# Patient Record
Sex: Female | Born: 1955
Health system: Southern US, Community
[De-identification: ages and names within clinical notes are randomized; demographics above are authoritative.]

## PROBLEM LIST (undated history)

## (undated) DIAGNOSIS — Z860101 Personal history of adenomatous and serrated colon polyps: Secondary | ICD-10-CM

## (undated) DIAGNOSIS — I25119 Atherosclerotic heart disease of native coronary artery with unspecified angina pectoris: Secondary | ICD-10-CM

## (undated) DIAGNOSIS — Z86010 Personal history of colonic polyps: Secondary | ICD-10-CM

## (undated) DIAGNOSIS — M1990 Unspecified osteoarthritis, unspecified site: Secondary | ICD-10-CM

## (undated) DIAGNOSIS — E668 Other obesity: Secondary | ICD-10-CM

## (undated) DIAGNOSIS — N951 Menopausal and female climacteric states: Secondary | ICD-10-CM

## (undated) DIAGNOSIS — G4730 Sleep apnea, unspecified: Secondary | ICD-10-CM

## (undated) DIAGNOSIS — Z780 Asymptomatic menopausal state: Secondary | ICD-10-CM

## (undated) DIAGNOSIS — E6689 Other obesity not elsewhere classified: Secondary | ICD-10-CM

## (undated) DIAGNOSIS — F17200 Nicotine dependence, unspecified, uncomplicated: Secondary | ICD-10-CM

## (undated) DIAGNOSIS — E785 Hyperlipidemia, unspecified: Secondary | ICD-10-CM

## (undated) DIAGNOSIS — E7800 Pure hypercholesterolemia, unspecified: Secondary | ICD-10-CM

## (undated) DIAGNOSIS — M79671 Pain in right foot: Secondary | ICD-10-CM

## (undated) DIAGNOSIS — I519 Heart disease, unspecified: Secondary | ICD-10-CM

## (undated) DIAGNOSIS — I10 Essential (primary) hypertension: Secondary | ICD-10-CM

## (undated) DIAGNOSIS — K219 Gastro-esophageal reflux disease without esophagitis: Secondary | ICD-10-CM

## (undated) DIAGNOSIS — I2510 Atherosclerotic heart disease of native coronary artery without angina pectoris: Secondary | ICD-10-CM

## (undated) DIAGNOSIS — E039 Hypothyroidism, unspecified: Secondary | ICD-10-CM

## (undated) DIAGNOSIS — I872 Venous insufficiency (chronic) (peripheral): Secondary | ICD-10-CM

## (undated) HISTORY — DX: Pure hypercholesterolemia, unspecified: E78.00

## (undated) HISTORY — DX: Personal history of adenomatous and serrated colon polyps: Z86.0101

## (undated) HISTORY — DX: Other obesity not elsewhere classified: E66.89

## (undated) HISTORY — DX: Asymptomatic menopausal state: Z78.0

## (undated) HISTORY — DX: Atherosclerotic heart disease of native coronary artery with unspecified angina pectoris: I25.119

## (undated) HISTORY — DX: Venous insufficiency (chronic) (peripheral): I87.2

## (undated) HISTORY — PX: OTHER SURGICAL HISTORY: SHX169

## (undated) HISTORY — DX: Atherosclerotic heart disease of native coronary artery without angina pectoris: I25.10

## (undated) HISTORY — DX: Essential (primary) hypertension: I10

## (undated) HISTORY — DX: Sleep apnea, unspecified: G47.30

## (undated) HISTORY — DX: Pain in right foot: M79.671

## (undated) HISTORY — DX: Gastro-esophageal reflux disease without esophagitis: K21.9

## (undated) HISTORY — DX: Heart disease, unspecified: I51.9

## (undated) HISTORY — DX: Personal history of colonic polyps: Z86.010

## (undated) HISTORY — DX: Hypothyroidism, unspecified: E03.9

## (undated) HISTORY — PX: CHOLECYSTECTOMY: SHX55

## (undated) HISTORY — DX: Other obesity: E66.8

## (undated) HISTORY — PX: ABDOMINAL HYSTERECTOMY: SHX81

## (undated) HISTORY — DX: Menopausal and female climacteric states: N95.1

## (undated) HISTORY — DX: Nicotine dependence, unspecified, uncomplicated: F17.200

## (undated) HISTORY — DX: Hyperlipidemia, unspecified: E78.5

---

## 1998-05-15 HISTORY — PX: CORONARY ARTERY BYPASS GRAFT: SHX141

## 1998-07-27 ENCOUNTER — Inpatient Hospital Stay (HOSPITAL_COMMUNITY): Admission: AD | Admit: 1998-07-27 | Discharge: 1998-08-06 | Payer: Self-pay | Admitting: *Deleted

## 1998-07-29 ENCOUNTER — Encounter: Payer: Self-pay | Admitting: Thoracic Surgery (Cardiothoracic Vascular Surgery)

## 1998-07-30 ENCOUNTER — Encounter: Payer: Self-pay | Admitting: Thoracic Surgery (Cardiothoracic Vascular Surgery)

## 1998-07-31 ENCOUNTER — Encounter: Payer: Self-pay | Admitting: Thoracic Surgery (Cardiothoracic Vascular Surgery)

## 1998-08-01 ENCOUNTER — Encounter: Payer: Self-pay | Admitting: Thoracic Surgery (Cardiothoracic Vascular Surgery)

## 1998-08-02 ENCOUNTER — Encounter: Payer: Self-pay | Admitting: Thoracic Surgery (Cardiothoracic Vascular Surgery)

## 1998-08-03 ENCOUNTER — Encounter: Payer: Self-pay | Admitting: Thoracic Surgery (Cardiothoracic Vascular Surgery)

## 1998-08-05 ENCOUNTER — Encounter: Payer: Self-pay | Admitting: Thoracic Surgery (Cardiothoracic Vascular Surgery)

## 1998-08-10 ENCOUNTER — Inpatient Hospital Stay (HOSPITAL_COMMUNITY): Admission: EM | Admit: 1998-08-10 | Discharge: 1998-08-15 | Payer: Self-pay | Admitting: Emergency Medicine

## 1998-08-10 ENCOUNTER — Encounter: Payer: Self-pay | Admitting: Thoracic Surgery (Cardiothoracic Vascular Surgery)

## 1998-08-11 ENCOUNTER — Encounter: Payer: Self-pay | Admitting: Thoracic Surgery (Cardiothoracic Vascular Surgery)

## 1998-08-23 ENCOUNTER — Other Ambulatory Visit: Admission: RE | Admit: 1998-08-23 | Discharge: 1998-08-23 | Payer: Self-pay | Admitting: Gastroenterology

## 2000-12-27 ENCOUNTER — Ambulatory Visit (HOSPITAL_COMMUNITY): Admission: RE | Admit: 2000-12-27 | Discharge: 2000-12-27 | Payer: Self-pay | Admitting: Pulmonary Disease

## 2002-02-04 ENCOUNTER — Encounter (HOSPITAL_COMMUNITY): Admission: RE | Admit: 2002-02-04 | Discharge: 2002-03-06 | Payer: Self-pay | Admitting: Cardiology

## 2002-02-04 ENCOUNTER — Ambulatory Visit (HOSPITAL_COMMUNITY): Admission: RE | Admit: 2002-02-04 | Discharge: 2002-02-04 | Payer: Self-pay | Admitting: Pulmonary Disease

## 2002-04-14 ENCOUNTER — Ambulatory Visit (HOSPITAL_COMMUNITY): Admission: RE | Admit: 2002-04-14 | Discharge: 2002-04-14 | Payer: Self-pay | Admitting: Internal Medicine

## 2002-04-14 HISTORY — PX: COLONOSCOPY: SHX174

## 2002-07-04 ENCOUNTER — Ambulatory Visit (HOSPITAL_COMMUNITY): Admission: RE | Admit: 2002-07-04 | Discharge: 2002-07-04 | Payer: Self-pay | Admitting: Pulmonary Disease

## 2003-05-05 ENCOUNTER — Ambulatory Visit (HOSPITAL_COMMUNITY): Admission: RE | Admit: 2003-05-05 | Discharge: 2003-05-05 | Payer: Self-pay | Admitting: Pulmonary Disease

## 2004-04-13 ENCOUNTER — Ambulatory Visit: Payer: Self-pay

## 2005-02-27 ENCOUNTER — Ambulatory Visit (HOSPITAL_COMMUNITY): Admission: RE | Admit: 2005-02-27 | Discharge: 2005-02-27 | Payer: Self-pay | Admitting: Pulmonary Disease

## 2006-03-12 ENCOUNTER — Ambulatory Visit (HOSPITAL_COMMUNITY): Admission: RE | Admit: 2006-03-12 | Discharge: 2006-03-12 | Payer: Self-pay | Admitting: Pulmonary Disease

## 2006-07-20 ENCOUNTER — Ambulatory Visit: Payer: Self-pay | Admitting: Cardiology

## 2006-08-07 ENCOUNTER — Ambulatory Visit: Payer: Self-pay

## 2006-08-08 ENCOUNTER — Ambulatory Visit: Payer: Self-pay

## 2007-04-12 ENCOUNTER — Ambulatory Visit (HOSPITAL_COMMUNITY): Admission: RE | Admit: 2007-04-12 | Discharge: 2007-04-12 | Payer: Self-pay | Admitting: Pulmonary Disease

## 2007-04-16 ENCOUNTER — Ambulatory Visit (HOSPITAL_COMMUNITY): Admission: RE | Admit: 2007-04-16 | Discharge: 2007-04-16 | Payer: Self-pay | Admitting: Internal Medicine

## 2007-04-16 ENCOUNTER — Ambulatory Visit: Payer: Self-pay | Admitting: Internal Medicine

## 2007-04-16 ENCOUNTER — Encounter: Payer: Self-pay | Admitting: Internal Medicine

## 2007-04-16 HISTORY — PX: COLONOSCOPY: SHX5424

## 2007-05-22 ENCOUNTER — Ambulatory Visit (HOSPITAL_COMMUNITY): Admission: RE | Admit: 2007-05-22 | Discharge: 2007-05-22 | Payer: Self-pay | Admitting: Internal Medicine

## 2007-05-23 ENCOUNTER — Ambulatory Visit (HOSPITAL_COMMUNITY): Admission: RE | Admit: 2007-05-23 | Discharge: 2007-05-23 | Payer: Self-pay | Admitting: Pulmonary Disease

## 2007-10-21 ENCOUNTER — Ambulatory Visit (HOSPITAL_COMMUNITY): Admission: RE | Admit: 2007-10-21 | Discharge: 2007-10-21 | Payer: Self-pay | Admitting: Cardiology

## 2009-02-10 ENCOUNTER — Encounter: Payer: Self-pay | Admitting: Orthopedic Surgery

## 2009-02-10 ENCOUNTER — Ambulatory Visit (HOSPITAL_COMMUNITY): Admission: RE | Admit: 2009-02-10 | Discharge: 2009-02-10 | Payer: Self-pay | Admitting: Pulmonary Disease

## 2009-02-18 ENCOUNTER — Encounter: Payer: Self-pay | Admitting: Orthopedic Surgery

## 2009-02-18 ENCOUNTER — Ambulatory Visit (HOSPITAL_COMMUNITY): Admission: RE | Admit: 2009-02-18 | Discharge: 2009-02-18 | Payer: Self-pay | Admitting: Pulmonary Disease

## 2009-03-15 ENCOUNTER — Ambulatory Visit: Payer: Self-pay | Admitting: Orthopedic Surgery

## 2009-03-15 DIAGNOSIS — M76829 Posterior tibial tendinitis, unspecified leg: Secondary | ICD-10-CM | POA: Insufficient documentation

## 2009-05-17 ENCOUNTER — Ambulatory Visit: Payer: Self-pay | Admitting: Orthopedic Surgery

## 2009-10-29 ENCOUNTER — Ambulatory Visit: Admission: RE | Admit: 2009-10-29 | Discharge: 2009-10-29 | Payer: Self-pay | Admitting: Pulmonary Disease

## 2010-01-27 ENCOUNTER — Ambulatory Visit: Payer: Self-pay | Admitting: Otolaryngology

## 2010-03-22 ENCOUNTER — Ambulatory Visit (HOSPITAL_COMMUNITY): Admission: RE | Admit: 2010-03-22 | Discharge: 2010-03-22 | Payer: Self-pay | Admitting: Pulmonary Disease

## 2010-06-14 NOTE — Assessment & Plan Note (Signed)
Summary: 6 WK RECK FOOT AFTER BRACE/BCBS/BSF   Visit Type:  Follow-up Referring Provider:  Dr Juanetta Gosling   CC:  ankle pain.  History of Present Illness: I saw Mariah Stewart in the office today for a followup visit.  She is a 55 years old woman with the complaint of:  DX: Posterior tibial tendon tear.  Treatment: Short cam walker    Complaints: doing better, swelling is down, no pain.  No meds for this.  Today, scheduled for: 6 week recheck right foot after cam walker.   Physical examination  She has excellent posterior tibial strength in plantarflexion and inversion. She has functional range of motion the ankle. There is no tenderness or swelling in the medial ankle.  Assessment improved status post posterior tibial tendon partial tearing. Plan normal activity. Follow up as needed  Allergies: No Known Drug Allergies   Impression & Recommendations:  Problem # 1:  TENDINITIS TIBIALIS (ICD-726.72) Assessment Improved  Orders: Est. Patient Level II (22025)  Patient Instructions: 1)  Please schedule a follow-up appointment as needed.

## 2010-09-27 NOTE — Op Note (Signed)
NAME:  Mariah Stewart, Mariah Stewart                 ACCOUNT NO.:  [id number]   MEDICAL RECORD NO.:  [id number]          PATIENT TYPE:  AMB   LOCATION:  DAY                           FACILITY:  [facility]   PHYSICIAN:  R. Roetta Sessions, M.D. DATE OF BIRTH:  [birth year]   DATE OF PROCEDURE:  04/16/2007  DATE OF DISCHARGE:                               OPERATIVE REPORT   INDICATIONS FOR PROCEDURE:  The patient is a 51-year African-American  female with a personal history of colonic adenomas, positive family  history of colon cancer in her mother.  Dr. Karilyn Cota last performed  colonoscopy in 2003.  She had somewhat elongated redundant colon, poor  prep, but no abnormalities were seen.  Prior to this, she had a  colonoscopy down in [place], and polyps were found.  She currently  has no lower GI tract symptoms.  Colonoscopy is now being done as a  surveillance maneuver.  This approach has been discussed with the  patient at length.  Potential risks, benefits and alternatives have been  reviewed, questions answered.   PROCEDURE NOTE:  O2 saturation, blood pressure, pulse and respirations  were monitored throughout the entire procedure.  Conscious sedation  Versed 7 mg IV, Demerol 125 mg IV in divided doses.   INSTRUMENT:  Pentax video chip system.   FINDINGS:  Digital rectal exam revealed no abnormalities.   ENDOSCOPIC FINDINGS:  Prep was suboptimal with some pooling of viscous  stool throughout the colon.   Colon:  Colonic mucosa was surveyed from the rectosigmoid junction,  through the left, transverse, right colon to the ileocecal valve.  This  patient's colon was quite elongated and redundant.  I made it to  proximally 15 to 20 cm from the ileocecal valve.  I saw it in the  distance.  However, redundancy made cecal intubation a challenge.  In  fact, at that this level from the ileocecal valve, I spent 30 minutes  using a combination of external abdominal pressure and changing the  patient's position  through staff members to assist in my efforts to  intubate the cecum which ultimately never happened.  I saw approximately  one half of the cecal mucosa from the position stated.  There were no  obvious abnormalities, but the cecum was not cleared.  From this level,  the scope was slowly and cautiously withdrawn, and all previously  mentioned mucosal surfaces were again seen.  The patient had scattered  narrow mouth diverticula, and there was a diminutive polyp in the  ascending colon which was cold biopsied/removed.  The remainder of the  colonic mucosa appeared normal although poor prep made the exam more  difficult.  Scope was pulled down into the rectum where thorough  examination of the rectal mucosa including retroflexed view of the anal  verge demonstrated a 5-mm hyperplastic appearing polyp 10 cm in from the  anal verge.  This was cold biopsied and essentially totally removed.  There was a surprising amount of bleeding with this maneuver for which I  decided to go ahead and place a resolution clip.  The first clip  did not  deploy adequately and came off, so I placed a second clip with achieving  good hemostasis and removed the first with a Roth net.  The patient  tolerated the procedure well and was reactive to endoscopy.   IMPRESSION:  1. Diminutive rectal polyp removed as described above, status post      clipping, otherwise normal rectum.  2. Scattered diverticula throughout the colon.  Long, long redundant      colon with suboptimal prep made the exam more difficult.  Ascending      colon polyp removed as described above.  Ileocecal valve seen with      the cecum not completely seen as described above.   RECOMMENDATIONS:  1. Follow-up on pathology.  2. Plan for air-contrast barium enema to image the cecum in 1 month.  3. Diverticulosis literature provided Ms. Glab.  4. Further recommendations to follow.      Jonathon Bellows, M.D.  Electronically Signed      RMR/MEDQ  D:  04/16/2007  T:  04/16/2007  Job:  [id number]   cc:   Ramon Dredge L. Juanetta Gosling, M.D.  Fax: [phone]

## 2010-09-30 NOTE — Consult Note (Signed)
NAME:  Mariah Stewart, Mariah Stewart                           ACCOUNT NO.:  [id number]   MEDICAL RECORD NO.:  [id number]                   PATIENT TYPE:  OUT   LOCATION:  RAD                                  FACILITY:  [facility]   PHYSICIAN:  Edward L. Juanetta Gosling, M.D.             DATE OF BIRTH:  April 20, 1956   DATE OF CONSULTATION:  DATE OF DISCHARGE:  02/04/2002                                   CONSULTATION   REASON FOR CONSULTATION:  Colonoscopy.   HISTORY OF PRESENT ILLNESS:  Mariah Stewart is a pleasant 55 year old black female, a  patient of Dr. Juanetta Gosling, who presents today for a colonoscopy.  She has a  family history of colon cancer; her mother was diagnosed with colon cancer  in 2001 at age 51.  She received a colostomy with radiation therapy,  initially, but then had a recurrence with mets to her lungs and pelvis in  May 2003.  She is currently undergoing chemotherapy.  Mariah Stewart has had a history  of colonic polyps, diagnosed in 2000.  She tells me she is supposed to have  a colonoscopy every three years.  The initial colonoscopy was done by Dr.  Russella Dar.  She denies any abdominal pain, melena, rectal bleeding,  constipation, diarrhea, nausea or vomiting, heartburn, weight loss.   CURRENT MEDICATIONS:  1. Aspirin 325 mg q.d.  2. Synthroid 175 mcg.  3. Tarka 4/240 mg q.d.  4. Estratest h.s. q.d.  5. Demadex 20 q.d.  6. Lipitor 40 mg q.d.  7. K-Dur 20 mEq/L q.d.   ALLERGIES:  No known drug allergies.   PAST MEDICAL HISTORY:  1. Hypothyroidism.  2. Hypertension.  3. Hypercholesterolemia.  4. Hormone replacement therapy.  5. Coronary artery disease status post CABG, 3 vessel CABG in 2000.     Cardiolite Stress Test in October 2003 was normal, per her report.  She     sees Dr. Daleen Squibb, regularly.  6. Cholecystotomy, 15 years ago for cholelithiasis.  7. Hysterectomy in 1996.   FAMILY HISTORY:  Mother was diagnosed with colon cancer at the age of 74.  She initially had  colostomy with radiation therapy.  She  had a recurrence  of the disease with mets to the lung and pelvis noted in May 2003.  She is  currently undergone 6 treatments with chemotherapy.  Father died of MI.   SOCIAL HISTORY:  She is single; she has no children.  She is employed with  [employer].  She quit smoking in 2000; denies any alcohol.   REVIEW OF SYSTEMS:  Please see HPI  GI.  GENERAL:  Denies any weight loss or  fatigue.  Cardiopulmonary, occasionally has some dyspnea on exertion, no  chest pain or shortness of breath at rest.   PHYSICAL EXAMINATION:  VITAL SIGNS:  Weight:  251.5, Height:  5' 3, temp  97.9, blood pressure 130/84, pulse 80.  GENERAL:  A very pleasant,  moderately obese black female in no acute  distress.  SKIN:  Warm and dry, no jaundice.  HEENT:  Conjunctiva are pink.  Sclerae are nonicteric.  Pupils equal, round  and reactive to light.  Oropharyngeal mucosa moist and pink, no lesions,  erythema, or exudate.  No lymphadenopathy, thyromegaly, carotid bruits.  CHEST:  Clear to auscultation.  CARDIAC:  Reveals regular rate and rhythm, normal S1, S2, no murmurs, rubs  or gallops.  ABDOMEN:  Positive bowel sounds; obese, symmetrical, soft and nontender.  No  organomegaly or masses.  RECTAL:  Deferred to total colostomy.  EXTREMITIES:  No edema.   IMPRESSION:  Mariah Stewart is a pleasant 55 year old female, who has a family history  of colon cancer and personal history of colonic polyps.  Her last  colonoscopy was in the year 2000.  She is due for high risk surveilance  colonoscopy at this time.  I discussed with her the concerns and benefits of  a colonoscopy, and she is agreeable to proceed.   PLAN:  1. Colonoscopy in the near future.   I would like to thank Dr. Juanetta Gosling for allowing [place] to take part in the care  of this patient.   __________     Tana Coast, P.A.                        Edward L. Juanetta Gosling, M.D.    LL/MEDQ  D:  03/31/2002  T:  03/31/2002  Job:  [id number]   cc:   Ramon Dredge L. Juanetta Gosling,  M.D.  [address]  [place]  [place] [address]  Fax: [phone]

## 2010-09-30 NOTE — Op Note (Signed)
NAME:  Mariah Stewart, Mariah Stewart                           ACCOUNT NO.:  [id number]   MEDICAL RECORD NO.:  [id number]                   PATIENT TYPE:  AMB   LOCATION:  DAY                                  FACILITY:  [facility]   PHYSICIAN:  Lionel December, M.D.                 DATE OF BIRTH:  [birth year]   DATE OF PROCEDURE:  04/14/2002  DATE OF DISCHARGE:                                 OPERATIVE REPORT   PROCEDURE:  Total colonoscopy.   INDICATIONS:  The patient is a 55 year old African-American female with  history of colonic polyps and a family history of colon carcinoma.  Her last  colonoscopy was about three years ago in [place], and she decided to have  this exam locally.  Her mother was diagnosed with colon carcinoma at age 88.  She presently does not have any GI symptoms.  The procedure and risks were  reviewed with the patient and informed consent was obtained.   PREMEDICATION:  Demerol 50 mg IV, Versed 6 mg IV in divided dose.   INSTRUMENT USED:  Olympus video system.   FINDINGS:  Procedure performed in endoscopy suite.  The patient's vital  signs and O2 saturation were monitored during the procedure and remained  stable.  The patient was placed in the left lateral recumbent position and  rectal examination performed.  This was within normal limits.  The scope was  placed in the rectum and advanced under vision into sigmoid colon and  beyond.  Preparation was fair.  She had thick liquid stool in various  segments of the colon.  The scope was passed to the cecum, which was  identified by the ileocecal valve.  The cecum was normal.  As the scope was  withdrawn, colonic mucosa was once again carefully examined and several  areas had to be washed free of stool to make sure underlying mucosa was  normal.  There were no polyps or tumor masses.  Rectal mucosa similarly was  normal.  The scope was retroflexed to examine the anorectal junction.  Hemorrhoids were noted below the dentate line.   The endoscope was  straightened and withdrawn.  The patient tolerated the procedure well.   FINAL DIAGNOSES:  1. Examination performed to the cecum.  2. External hemorrhoids, otherwise normal examination.  Preparation was less     than ideal.    RECOMMENDATIONS:  She should continue yearly Hemoccults and should return  for colonoscopy no later than five years from now.                                               Lionel December, M.D.    NR/MEDQ  D:  04/14/2002  T:  04/14/2002  Job:  [id number]   cc:   Ramon Dredge L.  Juanetta Gosling, M.D.  [address]  [place]  [place] [address]  Fax: [phone]

## 2010-09-30 NOTE — Assessment & Plan Note (Signed)
[facility]                            CARDIOLOGY OFFICE NOTE   Mariah Stewart                        MRN:          [id number]  DATE:07/20/2006                            DOB:          14-Jun-1955    Mariah Stewart returns today for further management of the following issues.  Coronary artery disease status post coronary artery bypass grafting  September of 2000.  Her ejection fraction was 60%.  Her last stress  Myoview was in 2003, which basically showed no ischemia.  EF of 65%.   She is having no angina or ischemic symptoms.   She has a history of hypertension, which has been difficult to control,  as well as hyperlipidemia.  She is following along with Dr. Juanetta Stewart for  this.  She brings recent blood work in November 2007 which shows her  lipids to be at goal, except for a low HDL.  Normal TSH.  Normal LFTs.  Normal CBC.  Normal chemistry profile including creatinine of 0.74.   EXAM:  Blood pressure is 148/94, pulse 87 and regular, weight 243.  She is extremely pleasant, as always.  HEENT:  Normocephalic, atraumatic.  PERRLA.  Extraocular movements  intact.  Sclerae clear.  Dentition is satisfactory.  NECK:  Supple.  Carotid upstrokes equal bilaterally without bruits.  No  JVD.  Thyroid is not enlarged.  Trachea is midline.  LUNGS:  Clear.  HEART:  Reveals a poorly-appreciated PMI.  She has normal S1, S2.  ABDOMEN:  Protuberant.  Good bowel sounds.  No organomegaly.  EXTREMITIES:  1+ edema.  Pulses are intact.   EKG:  Normal, except for some nonspecific ST-T wave changes.  This is  unchanged from before.   ASSESSMENT AND PLAN:  Coronary artery disease, currently without any  symptoms of clinical ischemia.   I have asked her to return for an exercise rest/stress Myoview.  If this  is negative for ischemia or low risk, we will see her back in a year.     Redcay C. Daleen Squibb, MD, [facility]  Electronically Signed    TCW/MedQ  DD: 07/20/2006  DT:  07/21/2006  Job #: [id number]   cc:   Ramon Dredge L. Mariah Stewart, M.D.

## 2011-02-01 ENCOUNTER — Other Ambulatory Visit (HOSPITAL_COMMUNITY): Payer: Self-pay | Admitting: Pulmonary Disease

## 2011-02-01 ENCOUNTER — Ambulatory Visit (HOSPITAL_COMMUNITY)
Admission: RE | Admit: 2011-02-01 | Discharge: 2011-02-01 | Disposition: A | Discharge status: Home / Self Care | Payer: BC Managed Care – PPO | Source: Ambulatory Visit | Attending: Pulmonary Disease | Admitting: Pulmonary Disease

## 2011-02-01 DIAGNOSIS — M79671 Pain in right foot: Secondary | ICD-10-CM

## 2011-02-01 DIAGNOSIS — M25579 Pain in unspecified ankle and joints of unspecified foot: Secondary | ICD-10-CM | POA: Insufficient documentation

## 2011-02-01 DIAGNOSIS — M898X9 Other specified disorders of bone, unspecified site: Secondary | ICD-10-CM | POA: Insufficient documentation

## 2011-02-01 DIAGNOSIS — Z139 Encounter for screening, unspecified: Secondary | ICD-10-CM

## 2011-03-27 ENCOUNTER — Ambulatory Visit (HOSPITAL_COMMUNITY)
Admission: RE | Admit: 2011-03-27 | Discharge: 2011-03-27 | Disposition: A | Discharge status: Home / Self Care | Payer: BC Managed Care – PPO | Source: Ambulatory Visit | Attending: Pulmonary Disease | Admitting: Pulmonary Disease

## 2011-03-27 DIAGNOSIS — Z139 Encounter for screening, unspecified: Secondary | ICD-10-CM

## 2011-03-27 DIAGNOSIS — Z1231 Encounter for screening mammogram for malignant neoplasm of breast: Secondary | ICD-10-CM | POA: Insufficient documentation

## 2011-04-18 ENCOUNTER — Other Ambulatory Visit (HOSPITAL_COMMUNITY): Payer: Self-pay | Admitting: Pulmonary Disease

## 2011-04-18 DIAGNOSIS — Z780 Asymptomatic menopausal state: Secondary | ICD-10-CM

## 2011-04-18 DIAGNOSIS — Z139 Encounter for screening, unspecified: Secondary | ICD-10-CM

## 2011-04-20 ENCOUNTER — Encounter: Payer: Self-pay | Admitting: Internal Medicine

## 2011-04-24 ENCOUNTER — Ambulatory Visit (INDEPENDENT_AMBULATORY_CARE_PROVIDER_SITE_OTHER): Payer: BC Managed Care – PPO | Admitting: Gastroenterology

## 2011-04-24 ENCOUNTER — Encounter: Payer: Self-pay | Admitting: Gastroenterology

## 2011-04-24 VITALS — BP 130/80 | HR 67 | Temp 97.9°F | Ht 63.0 in | Wt 250.4 lb

## 2011-04-24 DIAGNOSIS — K921 Melena: Secondary | ICD-10-CM

## 2011-04-24 DIAGNOSIS — Z800 Family history of malignant neoplasm of digestive organs: Secondary | ICD-10-CM | POA: Insufficient documentation

## 2011-04-24 NOTE — Progress Notes (Unsigned)
Saw she was taking Naprosyn daily. Can we find out if this is just prn or if she does take this religiously?  Thanks!

## 2011-04-24 NOTE — Assessment & Plan Note (Signed)
Proceed with surveillance colonoscopy.

## 2011-04-24 NOTE — Patient Instructions (Signed)
We have set you up for a colonoscopy with Dr. Jena Gauss.  Further recommendations to follow.  Have a [place] Christmas!

## 2011-04-24 NOTE — Progress Notes (Signed)
Referring Provider: Fredirick Maudlin, MD Primary Care Physician:  Fredirick Maudlin, MD Primary Gastroenterologist:  Dr. Jena Gauss   Chief Complaint  Patient presents with  . Rectal Bleeding    HPI:   [name] is a pleasant 55 year old female who comes today at the request of Dr. Juanetta Gosling secondary to intermittent rectal bleeding. She has a history of adenomatous polyps in the past, as well as a FH of colon cancer in her mother, diagnosed at age 41. Her last colonoscopy was in December 2008, with tubular adenoma and scattered diverticula. She reports three different episodes of painless hematochezia. No rectal pruritis or burning. No bowel changes. Denies abdominal pain, N/V. No wt loss. She believes she may have gained weight. She was started on Protonix a few months ago and reports doing well without any dysphagia, breakthrough reflux.    Past Medical History  Diagnosis Date  . Hypertension   . Hypercholesterolemia   . GERD (gastroesophageal reflux disease)   . CAD (coronary artery disease)   . Hypothyroidism   . Hx of adenomatous colonic polyps     Past Surgical History  Procedure Date  . Colonoscopy 04/16/07    diminutive rectal polyp removed, scattered diverticula, path: tubular adenoma  . Colonoscopy 04/14/02    external hemorrhoids otherwise normal  . S/p hysterectomy   . Coronary artery bypass graft 2000    triple bypass  . Cholecystectomy     Current Outpatient Prescriptions  Medication Sig Dispense Refill  . aspirin 81 MG tablet Take 81 mg by mouth daily.        . CRESTOR 10 MG tablet Take 10 mg by mouth daily.       Marland Kitchen EST ESTROGENS-METHYLTEST HS 0.625-1.25 MG per tablet Take 1 tablet by mouth daily.       Marland Kitchen levothyroxine (SYNTHROID, LEVOTHROID) 175 MCG tablet Take 175 mcg by mouth daily.       Marland Kitchen lisinopril-hydrochlorothiazide (PRINZIDE,ZESTORETIC) 20-12.5 MG per tablet Take 1 tablet by mouth.       . losartan (COZAAR) 100 MG tablet Take 100 mg by mouth daily.         . naproxen (NAPROSYN) 500 MG tablet Take 500 mg by mouth daily.       . pantoprazole (PROTONIX) 40 MG tablet Take 40 mg by mouth daily.       . verapamil (CALAN-SR) 240 MG CR tablet Take 240 mg by mouth at bedtime.         Allergies as of 04/24/2011  . (No Known Allergies)    Family History  Problem Relation Age of Onset  . Colon cancer Mother     diagnosed at age 71, passed away at age 55 from metastasis    History   Social History  . Marital Status: Single    Spouse Name: N/A    Number of Children: 0  . Years of Education: N/A   Occupational History  . full-time [employer]   Social History Main Topics  . Smoking status: Current Everyday Smoker -- 0.5 packs/day    Types: Cigarettes  . Smokeless tobacco: Not on file  . Alcohol Use: No  . Drug Use: No  . Sexually Active: Not on file   Other Topics Concern  . Not on file   Social History Narrative  . No narrative on file    Review of Systems: Gen: Denies any fever, chills, loss of appetite, fatigue, weight loss. CV: Denies chest pain, heart palpitations, syncope, peripheral edema. Resp:  Denies shortness of breath with rest, cough, wheezing GI: Denies dysphagia or odynophagia. Denies hematemesis, fecal incontinence, or jaundice.  GU : Denies urinary burning, urinary frequency, urinary incontinence.  MS: Denies joint pain, muscle weakness, cramps, limited movement Derm: Denies rash, itching, dry skin Psych: Denies depression, anxiety, confusion or memory loss  Heme: Denies bruising, bleeding, and enlarged lymph nodes.  Physical Exam: BP 130/80  Pulse 67  Temp(Src) 97.9 F (36.6 C) (Temporal)  Ht 5\' 3"  (1.6 m)  Wt 250 lb 6.4 oz (113.581 kg)  BMI 44.36 kg/m2 General:   Alert and oriented. Well-developed, well-nourished, pleasant and cooperative. Head:  Normocephalic and atraumatic. Eyes:  Conjunctiva pink, sclera clear, no icterus.   Conjunctiva pink. Ears:  Normal auditory acuity. Nose:  No  deformity, discharge,  or lesions. Mouth:  No deformity or lesions, mucosa pink and moist.  Neck:  Supple, without mass or thyromegaly. Lungs:  Clear to auscultation bilaterally, without wheezing, rales, or rhonchi.  Heart:  S1, S2 present without murmurs noted.  Abdomen:  +BS, soft, obese, non-tender and non-distended. Without mass or HSM. No rebound or guarding. No hernias noted. Rectal:  Deferred  Msk:  Symmetrical without gross deformities. Normal posture. Extremities:  Without clubbing or edema. Neurologic:  Alert and  oriented x4;  grossly normal neurologically. Skin:  Intact, warm and dry without significant lesions or rashes Cervical Nodes:  No significant cervical adenopathy. Psych:  Alert and cooperative. Normal mood and affect.

## 2011-04-24 NOTE — Assessment & Plan Note (Addendum)
55 year old female with intermittent painless hematochezia noted, no other symptoms. Last colonoscopy in December 2008 as outlined above. Does have hx of adenomatous polyps, as well as FH of colon ca in first degree relative (Mother, age 27). Likely due to benign anorectal source, but we will need to proceed with colonoscopy to further assess source.   Proceed with TCS with Dr. Jena Gauss in near future: the risks, benefits, and alternatives have been discussed with the patient in detail. The patient states understanding and desires to proceed. Addendum 1/8: did not realize at time of visit that patient was on Naprosyn daily. We have finally been able to make contact with her to verify this medication. Consider d/c pending results of colonoscopy.

## 2011-04-24 NOTE — Progress Notes (Signed)
Cc to PCP 

## 2011-04-25 NOTE — Progress Notes (Signed)
Tried to call pt- LMOM 

## 2011-04-27 NOTE — Progress Notes (Signed)
Tried to call pt- LMOM 

## 2011-04-27 NOTE — Progress Notes (Signed)
REVIEWED.  

## 2011-05-04 NOTE — Progress Notes (Signed)
Tried to call pt- LMOM 

## 2011-05-10 NOTE — Progress Notes (Signed)
Mailed letter to pt

## 2011-05-15 ENCOUNTER — Other Ambulatory Visit (HOSPITAL_COMMUNITY): Payer: BC Managed Care – PPO

## 2011-05-15 NOTE — Progress Notes (Unsigned)
Pt called back, she takes naprosyn one daily.

## 2011-05-23 ENCOUNTER — Encounter: Payer: Self-pay | Admitting: Gastroenterology

## 2011-05-23 ENCOUNTER — Other Ambulatory Visit: Payer: Self-pay | Admitting: Gastroenterology

## 2011-05-23 MED ORDER — SODIUM CHLORIDE 0.45 % IV SOLN
Freq: Once | INTRAVENOUS | Status: AC
  Administered 2011-05-24: 08:00:00 via INTRAVENOUS

## 2011-05-23 MED ORDER — PEG 3350-KCL-NA BICARB-NACL 420 G PO SOLR: ORAL | Status: AC

## 2011-05-23 NOTE — Progress Notes (Signed)
Noted. I have added this to my original office visit.

## 2011-05-24 ENCOUNTER — Ambulatory Visit (HOSPITAL_COMMUNITY)
Admission: RE | Admit: 2011-05-24 | Discharge: 2011-05-24 | Disposition: A | Discharge status: Home / Self Care | Payer: BC Managed Care – PPO | Source: Ambulatory Visit | Attending: Internal Medicine | Admitting: Internal Medicine

## 2011-05-24 ENCOUNTER — Other Ambulatory Visit: Payer: Self-pay | Admitting: Internal Medicine

## 2011-05-24 ENCOUNTER — Encounter (HOSPITAL_COMMUNITY): Payer: Self-pay | Admitting: *Deleted

## 2011-05-24 ENCOUNTER — Encounter (HOSPITAL_COMMUNITY)
Admission: RE | Disposition: A | Discharge status: Home / Self Care | Payer: Self-pay | Source: Ambulatory Visit | Attending: Internal Medicine

## 2011-05-24 DIAGNOSIS — Z7982 Long term (current) use of aspirin: Secondary | ICD-10-CM | POA: Insufficient documentation

## 2011-05-24 DIAGNOSIS — E7800 Pure hypercholesterolemia, unspecified: Secondary | ICD-10-CM | POA: Insufficient documentation

## 2011-05-24 DIAGNOSIS — Z79899 Other long term (current) drug therapy: Secondary | ICD-10-CM | POA: Insufficient documentation

## 2011-05-24 DIAGNOSIS — K5730 Diverticulosis of large intestine without perforation or abscess without bleeding: Secondary | ICD-10-CM

## 2011-05-24 DIAGNOSIS — D126 Benign neoplasm of colon, unspecified: Secondary | ICD-10-CM | POA: Insufficient documentation

## 2011-05-24 DIAGNOSIS — I10 Essential (primary) hypertension: Secondary | ICD-10-CM | POA: Insufficient documentation

## 2011-05-24 DIAGNOSIS — K921 Melena: Secondary | ICD-10-CM

## 2011-05-24 DIAGNOSIS — Z800 Family history of malignant neoplasm of digestive organs: Secondary | ICD-10-CM

## 2011-05-24 DIAGNOSIS — Z860100 Personal history of colon polyps, unspecified: Secondary | ICD-10-CM | POA: Insufficient documentation

## 2011-05-24 DIAGNOSIS — K648 Other hemorrhoids: Secondary | ICD-10-CM

## 2011-05-24 HISTORY — PX: COLONOSCOPY: SHX5424

## 2011-05-24 SURGERY — COLONOSCOPY
Anesthesia: Moderate Sedation

## 2011-05-24 MED ORDER — MIDAZOLAM HCL 5 MG/5ML IJ SOLN
INTRAMUSCULAR | Status: DC | PRN
  Administered 2011-05-24: 1 mg via INTRAVENOUS
  Administered 2011-05-24: 2 mg via INTRAVENOUS
  Administered 2011-05-24 (×4): 1 mg via INTRAVENOUS
  Administered 2011-05-24: 2 mg via INTRAVENOUS
  Administered 2011-05-24: 1 mg via INTRAVENOUS

## 2011-05-24 MED ORDER — MEPERIDINE HCL 100 MG/ML IJ SOLN
INTRAMUSCULAR | Status: DC | PRN
  Administered 2011-05-24: 25 mg via INTRAVENOUS
  Administered 2011-05-24: 50 mg via INTRAVENOUS
  Administered 2011-05-24: 25 mg via INTRAVENOUS

## 2011-05-24 MED ORDER — MIDAZOLAM HCL 5 MG/5ML IJ SOLN
INTRAMUSCULAR | Status: AC
  Filled 2011-05-24: qty 10

## 2011-05-24 MED ORDER — STERILE WATER FOR IRRIGATION IR SOLN
Status: DC | PRN
  Administered 2011-05-24: 09:00:00

## 2011-05-24 MED ORDER — HYDROCORTISONE ACETATE 25 MG RE SUPP: 25.0000 mg | Freq: Two times a day (BID) | RECTAL | Status: AC

## 2011-05-24 MED ORDER — MEPERIDINE HCL 100 MG/ML IJ SOLN
INTRAMUSCULAR | Status: AC
  Filled 2011-05-24: qty 2

## 2011-05-24 NOTE — H&P (Signed)
  I have seen & examined the patient prior to the procedure(s) today and reviewed the history and physical/consultation.  There have been no changes.  After consideration of the risks, benefits, alternatives and imponderables, the patient has consented to the procedure(s).   

## 2011-05-24 NOTE — Op Note (Signed)
[facility] [address] [place], [place]  [address]  COLONOSCOPY PROCEDURE REPORT  PATIENT:  Mariah Stewart, Mariah Stewart  MR#:  [id number] BIRTHDATE:  15-Jul-1955, 55 yrs. old  GENDER:  female ENDOSCOPIST:  R. Roetta Sessions, MD FACP [facility] REF. BY:          Dr. Juanetta Gosling PROCEDURE DATE:  05/24/2011 PROCEDURE:  incomplete colonoscopy; colonoscopy biopsy  INDICATIONS:  paper hematochezia; positive family history: Cancer; personal history of colonic adenoma  INFORMED CONSENT:  The risks, benefits, alternatives and imponderables including but not limited to bleeding, perforation as well as the possibility of a missed lesion have been reviewed. The potential for biopsy, lesion removal, etc. have also been discussed.  Questions have been answered.  All parties agreeable. Please see the history and physical in the medical record for more information.  MEDICATIONS:  Versed 10 mg IV and Demerol 100 mg IV in divided doses.  DESCRIPTION OF PROCEDURE:  After a digital rectal exam was performed, the EC-3890Li ([id number]) colonoscope was advanced from the anus through the rectum and colon  to the ascending segment. The mucosal surfaces were carefully surveyed utilizing scope tip deflection to facilitate fold flattening as needed.  The scope was pulled down into the rectum where a thorough examination including retroflexion was performed.  Incomplete examination as throughout the <<PROCEDUREIMAGES>>  FINDINGS:  Suboptimal preparation. Long redundant colon. I was unable to reach the cecum in spite of an extended effort utilizing external          abdominal pressure and changing in the patient's position. Left-sided diverticulosis. Internal hemorrhoids. 2 diminutive polyps in the          mid  descending segment. I did make it to the right side where I visualized the ileocecal valve in the distance. However the cecum was          not seen.  THERAPEUTIC / DIAGNOSTIC MANEUVERS PERFORMED:   B2  diminutive polyps in the left colon removed with cold biopsy technique.  COMPLICATIONS:  none  CECAL WITHDRAWAL TIME:  not applicable  IMPRESSION:    Long redundant colon. Incomplete examination as described above. Left colon polyps-status post removal as described                        above. Left-sided diverticulosis. Internal hemorrhoids-likely source of hematochezia.  RECOMMENDATIONS:    Anusol suppositories. Daily fiber supplement. Air contrast barium enema to image right colon not seen adequately today. Followup on pathology  ______________________________ R. Roetta Sessions, MD Caleen Essex  CC:  Shaune Pollack, MD  n. eSIGNED:   R. Roetta Sessions at 05/24/2011 10:02 AM  Marcell Anger, [phone]

## 2011-05-28 ENCOUNTER — Encounter: Payer: Self-pay | Admitting: Internal Medicine

## 2011-05-29 ENCOUNTER — Encounter (HOSPITAL_COMMUNITY): Payer: Self-pay | Admitting: Internal Medicine

## 2011-06-26 ENCOUNTER — Ambulatory Visit (HOSPITAL_COMMUNITY): Payer: BC Managed Care – PPO

## 2011-06-30 ENCOUNTER — Ambulatory Visit (HOSPITAL_COMMUNITY)
Admission: RE | Admit: 2011-06-30 | Discharge: 2011-06-30 | Disposition: A | Discharge status: Home / Self Care | Payer: BC Managed Care – PPO | Source: Ambulatory Visit | Attending: Internal Medicine | Admitting: Internal Medicine

## 2011-06-30 ENCOUNTER — Other Ambulatory Visit (HOSPITAL_COMMUNITY): Payer: Self-pay | Admitting: Internal Medicine

## 2011-06-30 DIAGNOSIS — Z139 Encounter for screening, unspecified: Secondary | ICD-10-CM

## 2011-06-30 DIAGNOSIS — Z9889 Other specified postprocedural states: Secondary | ICD-10-CM | POA: Insufficient documentation

## 2011-06-30 DIAGNOSIS — K5730 Diverticulosis of large intestine without perforation or abscess without bleeding: Secondary | ICD-10-CM | POA: Insufficient documentation

## 2011-06-30 DIAGNOSIS — K921 Melena: Secondary | ICD-10-CM | POA: Insufficient documentation

## 2011-06-30 DIAGNOSIS — K6389 Other specified diseases of intestine: Secondary | ICD-10-CM | POA: Insufficient documentation

## 2012-04-10 ENCOUNTER — Telehealth: Payer: Self-pay

## 2012-04-10 ENCOUNTER — Encounter: Payer: Self-pay | Admitting: *Deleted

## 2012-04-10 ENCOUNTER — Encounter (HOSPITAL_COMMUNITY): Payer: Self-pay | Admitting: Pharmacy Technician

## 2012-04-10 NOTE — Telephone Encounter (Signed)
Letter mailed

## 2012-04-10 NOTE — Telephone Encounter (Signed)
Dawn, please see note by RMR and schedule pt appt to come in for ov please.

## 2012-04-10 NOTE — Telephone Encounter (Signed)
Message copied by Myra Rude on Wed Apr 10, 2012 10:23 AM ------      Message from: Corbin Ade      Created: Wed Apr 10, 2012 10:03 AM       Adenomas removed in 08 and (plus positive family history of colon cancer) one year ago I was unable to reach the cecum. Adenomas removed at that time as well. Air-contrast barium enema failed to opacify the cecum one year ago. Therefore, it is been 5 years since he had his entire colon image via colonoscopy so he really should have a colonoscopy now. He has a technically difficult colon. I will make my best effort to make the cecum but no guarantees. However, it is recommended now.  Also, schedule extra time for this procedure. A good prep is very important.      ----- Message -----         From: Evalee Mutton, LPN         Sent: 04/10/2012   9:34 AM           To: Corbin Ade, MD            Dr. Jena Gauss, this pt is on the recall for tcs in December. Pt just had tcs in January 2013. Her letter did not say when she needed to return. When do you want pt to repeat tcs?       ----- Message -----         From: Estell Harpin         Sent: 04/09/2012   4:18 PM           To: Evalee Mutton, LPN            Hey, this pt showed up on our recall list for Dec 13, however he had a colonoscopy in Jan 2013. Im not sure when he should come back. Can you please look into this for me? Thanks.

## 2012-04-15 ENCOUNTER — Telehealth: Payer: Self-pay | Admitting: Internal Medicine

## 2012-04-15 NOTE — Telephone Encounter (Signed)
Ive called and LMOM for patient to return my call to make an office visit for TCS consult

## 2012-04-15 NOTE — Telephone Encounter (Signed)
Message copied by Glendora Score on Mon Apr 15, 2012  2:45 PM ------      Message from: Corbin Ade      Created: Wed Apr 10, 2012 10:03 AM       Adenomas removed in 08 and (plus positive family history of colon cancer) one year ago I was unable to reach the cecum. Adenomas removed at that time as well. Air-contrast barium enema failed to opacify the cecum one year ago. Therefore, it is been 5 years since he had his entire colon image via colonoscopy so he really should have a colonoscopy now. He has a technically difficult colon. I will make my best effort to make the cecum but no guarantees. However, it is recommended now.  Also, schedule extra time for this procedure. A good prep is very important.      ----- Message -----         From: Evalee Mutton, LPN         Sent: 04/10/2012   9:34 AM           To: Corbin Ade, MD            Dr. Jena Gauss, this pt is on the recall for tcs in December. Pt just had tcs in January 2013. Her letter did not say when she needed to return. When do you want pt to repeat tcs?       ----- Message -----         From: Estell Harpin         Sent: 04/09/2012   4:18 PM           To: Evalee Mutton, LPN            Hey, this pt showed up on our recall list for Dec 13, however he had a colonoscopy in Jan 2013. Im not sure when he should come back. Can you please look into this for me? Thanks.

## 2012-04-17 NOTE — Patient Instructions (Addendum)
   Your procedure is scheduled on: 04/25/2012  Report to [facility] at 6:15    AM.  Call this number if you have problems the morning of surgery: [phone]   Remember:   Do not drink or eat food:After Midnight.  :  Take these medicines the morning of surgery with A SIP OF WATER: Levothyroxine, Lisinopril, Losartan, Protonix and Verapamil    Do not wear jewelry, make-up or nail polish.  Do not wear lotions, powders, or perfumes.   Do not shave 48 hours prior to surgery. Men may shave face and neck.  Do not bring valuables to the hospital.  Contacts, dentures or bridgework may not be worn into surgery.  Leave suitcase in the car. After surgery it may be brought to your room.  For patients admitted to the hospital, checkout time is 11:00 AM the day of discharge.   Patients discharged the day of surgery will not be allowed to drive home.    Special Instructions: Shower using CHG 2 nights before surgery and the night before surgery.  If you shower the day of surgery use CHG.  Use special wash - you have one bottle of CHG for all showers.  You should use approximately 1/3 of the bottle for each shower.   Please read over the following fact sheets that you were given: Pain Booklet, MRSA Information, Surgical Site Infection Prevention and Care and Recovery After Surgery    PATIENT INSTRUCTIONS POST-ANESTHESIA  IMMEDIATELY FOLLOWING SURGERY:  Do not drive or operate machinery for the first twenty four hours after surgery.  Do not make any important decisions for twenty four hours after surgery or while taking narcotic pain medications or sedatives.  If you develop intractable nausea and vomiting or a severe headache please notify your doctor immediately.  FOLLOW-UP:  Please make an appointment with your surgeon as instructed. You do not need to follow up with anesthesia unless specifically instructed to do so.  WOUND CARE INSTRUCTIONS (if applicable):  Keep a dry clean dressing on the  anesthesia/puncture wound site if there is drainage.  Once the wound has quit draining you may leave it open to air.  Generally you should leave the bandage intact for twenty four hours unless there is drainage.  If the epidural site drains for more than 36-48 hours please call the anesthesia department.  QUESTIONS?:  Please feel free to call your physician or the hospital operator if you have any questions, and they will be happy to assist you.

## 2012-04-18 ENCOUNTER — Encounter (HOSPITAL_COMMUNITY)
Admission: RE | Admit: 2012-04-18 | Discharge: 2012-04-18 | Disposition: A | Discharge status: Home / Self Care | Payer: BC Managed Care – PPO | Source: Ambulatory Visit | Attending: Podiatry | Admitting: Podiatry

## 2012-04-18 ENCOUNTER — Encounter (HOSPITAL_COMMUNITY): Payer: Self-pay

## 2012-04-18 ENCOUNTER — Ambulatory Visit (HOSPITAL_COMMUNITY)
Admission: RE | Admit: 2012-04-18 | Discharge: 2012-04-18 | Disposition: A | Discharge status: Home / Self Care | Payer: BC Managed Care – PPO | Source: Ambulatory Visit | Attending: Pulmonary Disease | Admitting: Pulmonary Disease

## 2012-04-18 DIAGNOSIS — M25579 Pain in unspecified ankle and joints of unspecified foot: Secondary | ICD-10-CM | POA: Insufficient documentation

## 2012-04-18 DIAGNOSIS — IMO0001 Reserved for inherently not codable concepts without codable children: Secondary | ICD-10-CM | POA: Insufficient documentation

## 2012-04-18 HISTORY — DX: Sleep apnea, unspecified: G47.30

## 2012-04-18 LAB — BASIC METABOLIC PANEL
BUN: 11 mg/dL (ref 6–23)
Chloride: 102 mEq/L (ref 96–112)
Creatinine, Ser: 0.81 mg/dL (ref 0.50–1.10)
GFR calc Af Amer: >90 mL/min (ref >90)
GFR calc non Af Amer: 79 mL/min — ABNORMAL LOW (ref >90)
Potassium: 3.3 mEq/L — ABNORMAL LOW (ref 3.5–5.1)

## 2012-04-18 LAB — CBC
HCT: 39.8 % (ref 36.0–46.0)
MCHC: 33.7 g/dL (ref 30.0–36.0)
MCV: 90.9 fL (ref 78.0–100.0)
Platelets: 202 10*3/uL (ref 150–400)
RDW: 13.9 % (ref 11.5–15.5)
WBC: 10.7 10*3/uL — ABNORMAL HIGH (ref 4.0–10.5)

## 2012-04-18 LAB — SURGICAL PCR SCREEN: Staphylococcus aureus: NEGATIVE

## 2012-04-18 NOTE — Progress Notes (Signed)
Physical Therapy Treatment Patient Details  Name: Mariah Stewart MRN: [id number] Date of Birth: 03-16-1956  Today's Date: 04/18/2012 Time: 1123-1158 PT Time Calculation (min): 35 min  Visit#: 1  of 1   Re-eval:   Assessment Diagnosis: Heel spur Surgical Date: 04/24/12 Prior Therapy: none  Subjective: Symptoms/Limitations Symptoms: Pt states she is having heel surgery on Wed. and needs to be non-weight bearing for several weeks after her surgery.    Exercise/Treatments Mobility/Balance  Ambulation/Gait Ambulation/Gait: Yes Ambulation/Gait Assistance: 6: Modified independent (Device/Increase time) Ambulation Distance (Feet):  (50) Assistive device: Rolling walker Stairs: Yes Stairs Assistance: 4: Min guard      Physical Therapy Assessment and Plan PT Assessment and Plan Clinical Impression Statement: Pt with weakened UE strength who needs to work on proper NWB gt prior to surgery next week.  PT was properly instructed in NWB shown steps and counseled of safety of house, ie take up all throw rugs. Pt will benefit from skilled therapeutic intervention in order to improve on the following deficits: Decreased activity tolerance Rehab Potential: Good PT Frequency: Min 1X/week PT Duration:  (1 week) PT Plan: Pt to practice steps and prolong NWB gt w. walker everyday between now and surgery    Goals PT Short Term Goals PT Short Term Goal 1: I in NWB gt w/ rolling walker  PT Short Term Goal 1 - Progress: Met  Problem List Patient Active Problem List  Diagnosis  . TENDINITIS TIBIALIS  . Hematochezia  . Family history of colon cancer    PT - End of Session Activity Tolerance: Patient tolerated treatment well General Behavior During Session: WFL for tasks performed Cognition: [facility] for tasks performed PT Plan of Care PT Home Exercise Plan: explained  GP Functional Limitation: Mobility: Walking and moving around Mobility: Walking and Moving Around Current Status (X9147):  0 percent impaired, limited or restricted Mobility: Walking and Moving Around Goal Status (W2956): 0 percent impaired, limited or restricted Mobility: Walking and Moving Around Discharge Status [phone]): 0 percent impaired, limited or restricted  Latona Krichbaum,CINDY 04/18/2012, 1:00 PM

## 2012-04-18 NOTE — Progress Notes (Signed)
Pt's pre-op labwork showed a K+ level of 3.3. Dr. Jayme Cloud notified, he was okay with it, no orders given.

## 2012-04-25 ENCOUNTER — Ambulatory Visit (HOSPITAL_COMMUNITY)
Admission: RE | Admit: 2012-04-25 | Discharge: 2012-04-25 | Disposition: A | Discharge status: Home / Self Care | Payer: BC Managed Care – PPO | Source: Ambulatory Visit | Attending: Podiatry | Admitting: Podiatry

## 2012-04-25 ENCOUNTER — Ambulatory Visit (HOSPITAL_COMMUNITY): Payer: BC Managed Care – PPO | Admitting: Anesthesiology

## 2012-04-25 ENCOUNTER — Encounter (HOSPITAL_COMMUNITY): Payer: Self-pay | Admitting: *Deleted

## 2012-04-25 ENCOUNTER — Encounter (HOSPITAL_COMMUNITY): Payer: Self-pay | Admitting: Anesthesiology

## 2012-04-25 ENCOUNTER — Ambulatory Visit (HOSPITAL_COMMUNITY): Payer: BC Managed Care – PPO

## 2012-04-25 ENCOUNTER — Encounter (HOSPITAL_COMMUNITY)
Admission: RE | Disposition: A | Discharge status: Home / Self Care | Payer: Self-pay | Source: Ambulatory Visit | Attending: Podiatry

## 2012-04-25 DIAGNOSIS — M898X9 Other specified disorders of bone, unspecified site: Secondary | ICD-10-CM | POA: Insufficient documentation

## 2012-04-25 DIAGNOSIS — M898X7 Other specified disorders of bone, ankle and foot: Secondary | ICD-10-CM

## 2012-04-25 DIAGNOSIS — M7660 Achilles tendinitis, unspecified leg: Secondary | ICD-10-CM | POA: Insufficient documentation

## 2012-04-25 DIAGNOSIS — F17200 Nicotine dependence, unspecified, uncomplicated: Secondary | ICD-10-CM | POA: Insufficient documentation

## 2012-04-25 DIAGNOSIS — I2510 Atherosclerotic heart disease of native coronary artery without angina pectoris: Secondary | ICD-10-CM | POA: Insufficient documentation

## 2012-04-25 DIAGNOSIS — I10 Essential (primary) hypertension: Secondary | ICD-10-CM | POA: Insufficient documentation

## 2012-04-25 HISTORY — PX: BONE EXOSTOSIS EXCISION: SHX1249

## 2012-04-25 SURGERY — EXCISION, EXOSTOSIS
Anesthesia: General | Site: Foot | Laterality: Left | Wound class: Clean

## 2012-04-25 MED ORDER — NEOSTIGMINE METHYLSULFATE 1 MG/ML IJ SOLN
INTRAMUSCULAR | Status: DC | PRN
  Administered 2012-04-25: 4 mg via INTRAVENOUS

## 2012-04-25 MED ORDER — SUCCINYLCHOLINE CHLORIDE 20 MG/ML IJ SOLN
INTRAMUSCULAR | Status: AC
  Filled 2012-04-25: qty 1

## 2012-04-25 MED ORDER — LIDOCAINE HCL (CARDIAC) 10 MG/ML IV SOLN
INTRAVENOUS | Status: DC | PRN
  Administered 2012-04-25: 50 mg via INTRAVENOUS

## 2012-04-25 MED ORDER — LABETALOL HCL 5 MG/ML IV SOLN
INTRAVENOUS | Status: DC | PRN
  Administered 2012-04-25: 5 mg via INTRAVENOUS

## 2012-04-25 MED ORDER — SUCCINYLCHOLINE CHLORIDE 20 MG/ML IJ SOLN
INTRAMUSCULAR | Status: DC | PRN
  Administered 2012-04-25: 100 mg via INTRAVENOUS

## 2012-04-25 MED ORDER — GLYCOPYRROLATE 0.2 MG/ML IJ SOLN
INTRAMUSCULAR | Status: AC
  Filled 2012-04-25: qty 1

## 2012-04-25 MED ORDER — ONDANSETRON HCL 4 MG/2ML IJ SOLN: 4.0000 mg | Freq: Once | INTRAMUSCULAR | Status: DC | PRN

## 2012-04-25 MED ORDER — FENTANYL CITRATE 0.05 MG/ML IJ SOLN
INTRAMUSCULAR | Status: AC
  Filled 2012-04-25: qty 2

## 2012-04-25 MED ORDER — ROCURONIUM BROMIDE 100 MG/10ML IV SOLN
INTRAVENOUS | Status: DC | PRN
  Administered 2012-04-25: 20 mg via INTRAVENOUS

## 2012-04-25 MED ORDER — MIDAZOLAM HCL 2 MG/2ML IJ SOLN
1.0000 mg | INTRAMUSCULAR | Status: DC | PRN
  Administered 2012-04-25: 2 mg via INTRAVENOUS

## 2012-04-25 MED ORDER — DEXTROSE 5 % IV SOLN
3.0000 g | INTRAVENOUS | Status: DC | PRN
  Administered 2012-04-25: 3 g via INTRAVENOUS

## 2012-04-25 MED ORDER — PROPOFOL 10 MG/ML IV EMUL
INTRAVENOUS | Status: AC
  Filled 2012-04-25: qty 20

## 2012-04-25 MED ORDER — FENTANYL CITRATE 0.05 MG/ML IJ SOLN
INTRAMUSCULAR | Status: AC
  Filled 2012-04-25: qty 5

## 2012-04-25 MED ORDER — BUPIVACAINE HCL (PF) 0.5 % IJ SOLN
INTRAMUSCULAR | Status: DC | PRN
  Administered 2012-04-25: 10 mL

## 2012-04-25 MED ORDER — 0.9 % SODIUM CHLORIDE (POUR BTL) OPTIME
TOPICAL | Status: DC | PRN
  Administered 2012-04-25: 500 mL

## 2012-04-25 MED ORDER — GLYCOPYRROLATE 0.2 MG/ML IJ SOLN
INTRAMUSCULAR | Status: DC | PRN
  Administered 2012-04-25: 0.2 mg via INTRAVENOUS
  Administered 2012-04-25: 0.6 mg via INTRAVENOUS

## 2012-04-25 MED ORDER — FENTANYL CITRATE 0.05 MG/ML IJ SOLN
25.0000 ug | INTRAMUSCULAR | Status: DC | PRN
  Administered 2012-04-25 (×2): 25 ug via INTRAVENOUS

## 2012-04-25 MED ORDER — CEFAZOLIN SODIUM-DEXTROSE 2-3 GM-% IV SOLR
INTRAVENOUS | Status: AC
  Filled 2012-04-25: qty 50

## 2012-04-25 MED ORDER — PROPOFOL 10 MG/ML IV EMUL
INTRAVENOUS | Status: DC | PRN
  Administered 2012-04-25 (×2): 50 mg via INTRAVENOUS
  Administered 2012-04-25: 150 mg via INTRAVENOUS

## 2012-04-25 MED ORDER — GLYCOPYRROLATE 0.2 MG/ML IJ SOLN
INTRAMUSCULAR | Status: AC
  Filled 2012-04-25: qty 3

## 2012-04-25 MED ORDER — LACTATED RINGERS IV SOLN
INTRAVENOUS | Status: DC | PRN
  Administered 2012-04-25 (×2): via INTRAVENOUS

## 2012-04-25 MED ORDER — CEFAZOLIN SODIUM 1-5 GM-% IV SOLN
INTRAVENOUS | Status: AC
  Filled 2012-04-25: qty 50

## 2012-04-25 MED ORDER — LACTATED RINGERS IV SOLN
INTRAVENOUS | Status: DC
  Administered 2012-04-25: 1000 mL via INTRAVENOUS

## 2012-04-25 MED ORDER — BUPIVACAINE HCL (PF) 0.5 % IJ SOLN
INTRAMUSCULAR | Status: AC
  Filled 2012-04-25: qty 30

## 2012-04-25 MED ORDER — ONDANSETRON HCL 4 MG/2ML IJ SOLN
INTRAMUSCULAR | Status: AC
  Filled 2012-04-25: qty 2

## 2012-04-25 MED ORDER — LABETALOL HCL 5 MG/ML IV SOLN
INTRAVENOUS | Status: AC
  Filled 2012-04-25: qty 4

## 2012-04-25 MED ORDER — MIDAZOLAM HCL 2 MG/2ML IJ SOLN
INTRAMUSCULAR | Status: AC
  Filled 2012-04-25: qty 2

## 2012-04-25 MED ORDER — FENTANYL CITRATE 0.05 MG/ML IJ SOLN
INTRAMUSCULAR | Status: DC | PRN
  Administered 2012-04-25 (×5): 50 ug via INTRAVENOUS
  Administered 2012-04-25: 100 ug via INTRAVENOUS

## 2012-04-25 MED ORDER — ONDANSETRON HCL 4 MG/2ML IJ SOLN
4.0000 mg | Freq: Once | INTRAMUSCULAR | Status: AC
  Administered 2012-04-25: 4 mg via INTRAVENOUS

## 2012-04-25 MED ORDER — NEOSTIGMINE METHYLSULFATE 1 MG/ML IJ SOLN
INTRAMUSCULAR | Status: AC
  Filled 2012-04-25: qty 1

## 2012-04-25 MED ORDER — DEXTROSE 5 % IV SOLN
3.0000 g | Freq: Once | INTRAVENOUS | Status: DC
  Filled 2012-04-25: qty 3000

## 2012-04-25 MED ORDER — LIDOCAINE HCL (PF) 1 % IJ SOLN
INTRAMUSCULAR | Status: AC
  Filled 2012-04-25: qty 30

## 2012-04-25 SURGICAL SUPPLY — 50 items
ADH SKN CLS APL DERMABOND .7 (GAUZE/BANDAGES/DRESSINGS) ×1
ANCH SUT 2 CRKSCW 12X3.5 ST (Anchor) ×1 IMPLANT
ANCHOR SUT CORKSCREW 3.5X12 (Anchor) ×2 IMPLANT
BAG HAMPER (MISCELLANEOUS) ×2 IMPLANT
BANDAGE ELASTIC 4 VELCRO NS (GAUZE/BANDAGES/DRESSINGS) ×2 IMPLANT
BANDAGE ESMARK 4X12 BL STRL LF (DISPOSABLE) ×1 IMPLANT
BANDAGE GAUZE ELAST BULKY 4 IN (GAUZE/BANDAGES/DRESSINGS) ×2 IMPLANT
BLADE AVERAGE 25X9 (BLADE) ×3 IMPLANT
BLADE OSC/SAG 18.5X9 THN (BLADE) ×1 IMPLANT
BLADE SURG 15 STRL LF DISP TIS (BLADE) ×1 IMPLANT
BLADE SURG 15 STRL SS (BLADE) ×4
BNDG CMPR 12X4 ELC STRL LF (DISPOSABLE) ×1
BNDG ESMARK 4X12 BLUE STRL LF (DISPOSABLE) ×2
CHLORAPREP W/TINT 26ML (MISCELLANEOUS) ×3 IMPLANT
CLOTH BEACON ORANGE TIMEOUT ST (SAFETY) ×2 IMPLANT
COVER LIGHT HANDLE STERIS (MISCELLANEOUS) ×4 IMPLANT
CUFF TOURNIQUET SINGLE 18IN (TOURNIQUET CUFF) ×2 IMPLANT
CUFF TOURNIQUET SINGLE 34IN LL (TOURNIQUET CUFF) ×2 IMPLANT
DERMABOND ADVANCED (GAUZE/BANDAGES/DRESSINGS) ×1
DERMABOND ADVANCED .7 DNX12 (GAUZE/BANDAGES/DRESSINGS) IMPLANT
DRAPE OEC MINIVIEW 54X84 (DRAPES) ×1 IMPLANT
DRSG ADAPTIC 3X8 NADH LF (GAUZE/BANDAGES/DRESSINGS) ×1 IMPLANT
DURA STEPPER LG (CAST SUPPLIES) IMPLANT
DURA STEPPER MED (CAST SUPPLIES) ×1 IMPLANT
DURA STEPPER SML (CAST SUPPLIES) IMPLANT
DURA STEPPER XL (SOFTGOODS) IMPLANT
ELECT REM PT RETURN 9FT ADLT (ELECTROSURGICAL) ×2
ELECTRODE REM PT RTRN 9FT ADLT (ELECTROSURGICAL) ×1 IMPLANT
GLOVE BIO SURGEON STRL SZ7.5 (GLOVE) ×2 IMPLANT
GOWN STRL REIN XL XLG (GOWN DISPOSABLE) ×5 IMPLANT
KIT ROOM TURNOVER APOR (KITS) ×2 IMPLANT
MANIFOLD NEPTUNE II (INSTRUMENTS) ×2 IMPLANT
NDL HYPO 18GX1.5 BLUNT FILL (NEEDLE) ×1 IMPLANT
NDL HYPO 27GX1-1/4 (NEEDLE) ×3 IMPLANT
NDL MAYO 6 CRC TAPER PT (NEEDLE) IMPLANT
NEEDLE HYPO 18GX1.5 BLUNT FILL (NEEDLE) ×2 IMPLANT
NEEDLE HYPO 27GX1-1/4 (NEEDLE) ×6 IMPLANT
NEEDLE MAYO 6 CRC TAPER PT (NEEDLE) ×2 IMPLANT
NS IRRIG 1000ML POUR BTL (IV SOLUTION) ×2 IMPLANT
PACK BASIC LIMB (CUSTOM PROCEDURE TRAY) ×2 IMPLANT
PAD ARMBOARD 7.5X6 YLW CONV (MISCELLANEOUS) ×2 IMPLANT
RASP SM TEAR CROSS CUT (RASP) ×2 IMPLANT
SET BASIN LINEN APH (SET/KITS/TRAYS/PACK) ×2 IMPLANT
SPONGE GAUZE 4X4 12PLY (GAUZE/BANDAGES/DRESSINGS) ×1 IMPLANT
SPONGE LAP 18X18 X RAY DECT (DISPOSABLE) ×1 IMPLANT
STRIP CLOSURE SKIN 1/2X4 (GAUZE/BANDAGES/DRESSINGS) ×3 IMPLANT
SUT VIC AB 4-0 PS2 27 (SUTURE) ×1 IMPLANT
SUT VICRYL AB 3-0 FS1 BRD 27IN (SUTURE) ×1 IMPLANT
SYR CONTROL 10ML LL (SYRINGE) ×6 IMPLANT
TOWEL OR 17X26 4PK STRL BLUE (TOWEL DISPOSABLE) ×3 IMPLANT

## 2012-04-25 NOTE — Addendum Note (Signed)
Addendum  created 04/25/12 1411 by Marolyn Hammock, CRNA   Modules edited:Anesthesia Medication Administration

## 2012-04-25 NOTE — Addendum Note (Signed)
Addendum  created 04/25/12 1020 by Marolyn Hammock, CRNA   Modules edited:Charges VN

## 2012-04-25 NOTE — Addendum Note (Signed)
Addendum  created 04/25/12 1410 by Marolyn Hammock, CRNA   Modules edited:Anesthesia Medication Administration

## 2012-04-25 NOTE — Brief Op Note (Addendum)
BRIEF OPERATIVE NOTE  SURGEON:   Dallas Schimke, DPM  OR STAFF:   Eliane Decree Page, RN - Circulator Hurshel Party, CST - Scrub Person Lizabeth Leyden, RN - RN First Assistant Lennox Pippins, RN - Circulator Assistant Jari Sportsman, CST - Float Surgical Tech   PREOPERATIVE DIAGNOSIS:   Retrocalcaneal exostosis with achilles tendonitis left foot  POSTOPERATIVE DIAGNOSIS: Same  PROCEDURE: Retrocalcaneal exostectomy left foot ANESTHESIA:  General   HEMOSTASIS:   Pneumatic thigh tourniquet set at 300 mmHg  ESTIMATED BLOOD LOSS:   Minimal (<5 cc)  MATERIALS USED:  Arthrex 3.5 mm anchor  INJECTABLES: 0.5% Marcaine plain  PATHOLOGY:   None  COMPLICATIONS:   None  INDICATIONS:  Painful spur along the posterior aspect of the left heel that has been non-responsive to non-surgical care.  DICTATION:  Note written in EPIC

## 2012-04-25 NOTE — Op Note (Signed)
OPERATIVE REPORT  SURGEON:   Dallas Schimke, [place]  OR STAFF:   Eliane Decree Page, RN - Circulator Hurshel Party, CST - Scrub Person Lizabeth Leyden, RN - RN First Assistant Lennox Pippins, RN - Circulator Assistant Jari Sportsman, CST - Float Surgical Tech   PREOPERATIVE DIAGNOSIS:   Retrocalcaneal exostosis with achilles tendonitis left foot  POSTOPERATIVE DIAGNOSIS: Same  PROCEDURE: Retrocalcaneal exostectomy left foot ANESTHESIA:  General   HEMOSTASIS:   Pneumatic thigh tourniquet set at 300 mmHg  ESTIMATED BLOOD LOSS:   Minimal (<5 cc)  MATERIALS USED:  Arthrex 3.5 mm anchor  INJECTABLES: 0.5% Marcaine plain  PATHOLOGY:   None  COMPLICATIONS:   None  INDICATIONS:  Painful spur along the posterior aspect of the left heel that has been non-responsive to non-surgical care.  DESCRIPTION OF THE PROCEDURE:   The patient was brought to the operating room.  Following induction of general anesthesia the patient was placed prone on the operative table.  A pneumatic thigh tourniquet was placed about the patient's left thigh.  The patient was scrubbed prepped and draped in the usual sterile manner.  The limb was then elevated and exsanguinated and the pneumatic tourniquet inflated to 300 mmHg.  Attention was directed to the posterior aspect of the left heel where a linear longitudinal incision was made slightly medial to the bisection of the achilles tendon.  Dissection was continued deep down to the level of the Achilles tendon.  A longitudinal incision was made in the peritenon.  The peritenon was reflected medially and laterally thus exposing the Achilles tendon at the operative site.  A longitudinal incision was made through the Achilles tendon and the central portion of the tendon was reflected medially and laterally to allow for exposure of the retrocalcaneal exostosis.  The most medial and most lateral aspects of the Achilles tendon were left attached  to the calcaneus.  The retrocalcaneal spur was removed using a power bone saw.  The bone edges were smoothed with a power rasp.  An Arthrex 3.5 mm corkscrew anchor was inserted into the posterior aspect of the calcaneus.  The corresponding suture was used to secure the Achilles tendon to the posterior aspect of the calcaneus.  3-0 Vicryl was used to reapproximate the longitudinal incision through the Achilles tendon.  The peritenon was reapproximated using 4-0 Vicryl.  The subcutaneous structure was reapproximated using 4-0 Vicryl.  The skin was reinforced using 4-0 Vicryl.  The incision was reinforced with Dermabond.  A sterile compressive dressing was applied to the operative foot.  The pneumatic ankle tourniquet was deflated and prompt hyperemic response was noted to all digits of the operative foot.  The patient tolerated the procedure well.  The patient was then transferred to PACU with vital signs stable and vascular status intact to all toes of the operative foot.  Following a period of postoperative monitoring, the patient will be discharged home.

## 2012-04-25 NOTE — Anesthesia Postprocedure Evaluation (Signed)
  Anesthesia Post-op Note  Patient: Mariah Stewart  Procedure(s) Performed: Procedure(s) (LRB) with comments: EXOSTOSIS EXCISION (Left) - Retrocalcaneal Exostectomy Left Foot  Patient Location: PACU  Anesthesia Type: General  Level of Consciousness: awake, alert , oriented and patient cooperative  Airway and Oxygen Therapy: Patient Spontanous Breathing and Patient connected to face mask oxygen  Post-op Pain: mild  Post-op Assessment: Post-op Vital signs reviewed, Patient's Cardiovascular Status Stable, Respiratory Function Stable, Patent Airway and No signs of Nausea or vomiting  Post-op Vital Signs: Reviewed and stable  Complications: No apparent anesthesia complications

## 2012-04-25 NOTE — Transfer of Care (Signed)
  Anesthesia Post-op Note  Patient: Mariah Stewart  Procedure(s) Performed: Procedure(s) (LRB) with comments: EXOSTOSIS EXCISION (Left) - Retrocalcaneal Exostectomy Left Foot  Patient Location: PACU  Anesthesia Type: General  Level of Consciousness: awake, alert , oriented and patient cooperative  Airway and Oxygen Therapy: Patient Spontanous Breathing and Patient connected to face mask oxygen  Post-op Pain: mild  Post-op Assessment: Post-op Vital signs reviewed, Patient's Cardiovascular Status Stable, Respiratory Function Stable, Patent Airway and No signs of Nausea or vomiting  Post-op Vital Signs: Reviewed and stable  Complications: No apparent anesthesia complications  

## 2012-04-25 NOTE — Addendum Note (Signed)
Addendum  created 04/25/12 0949 by Marolyn Hammock, CRNA   Modules edited:Charges VN

## 2012-04-25 NOTE — Preoperative (Signed)
Beta Blockers   Reason not to administer Beta Blockers:Not Applicable 

## 2012-04-25 NOTE — Anesthesia Procedure Notes (Signed)
Procedure Name: Intubation Date/Time: 04/25/2012 7:42 AM Performed by: Carolyne Littles, Lyndy Russman L Pre-anesthesia Checklist: Patient identified, Patient being monitored, Timeout performed, Emergency Drugs available and Suction available Patient Re-evaluated:Patient Re-evaluated prior to inductionOxygen Delivery Method: Circle System Utilized Preoxygenation: Pre-oxygenation with 100% oxygen Intubation Type: IV induction, Cricoid Pressure applied and Rapid sequence Grade View: Grade I Tube type: Oral Tube size: 7.0 mm Number of attempts: 1 Airway Equipment and Method: stylet and Video-laryngoscopy Placement Confirmation: ETT inserted through vocal cords under direct vision,  positive ETCO2 and breath sounds checked- equal and bilateral Secured at: 21 cm Tube secured with: Tape Dental Injury: Teeth and Oropharynx as per pre-operative assessment

## 2012-04-25 NOTE — Anesthesia Preprocedure Evaluation (Signed)
Anesthesia Evaluation  Patient identified by MRN, date of birth, ID band Patient awake    Reviewed: Allergy & Precautions, H&P , NPO status , Patient's Chart, lab work & pertinent test results  Airway Mallampati: III TM Distance: >3 FB   Mouth opening: Limited Mouth Opening  Dental  (+) Teeth Intact   Pulmonary sleep apnea , Current Smoker,  breath sounds clear to auscultation        Cardiovascular hypertension, Pt. on medications + CAD Rhythm:Regular Rate:Normal     Neuro/Psych    GI/Hepatic GERD-  Medicated and Controlled,  Endo/Other  Hypothyroidism   Renal/GU      Musculoskeletal   Abdominal   Peds  Hematology   Anesthesia Other Findings   Reproductive/Obstetrics                           Anesthesia Physical Anesthesia Plan  ASA: III  Anesthesia Plan: General   Post-op Pain Management:    Induction: Intravenous  Airway Management Planned: Oral ETT  Additional Equipment:   Intra-op Plan:   Post-operative Plan: Extubation in OR  Informed Consent: I have reviewed the patients History and Physical, chart, labs and discussed the procedure including the risks, benefits and alternatives for the proposed anesthesia with the patient or authorized representative who has indicated his/her understanding and acceptance.     Plan Discussed with:   Anesthesia Plan Comments:         Anesthesia Quick Evaluation

## 2012-04-25 NOTE — H&P (Signed)
HISTORY AND PHYSICAL INTERVAL NOTE:  04/25/2012  7:25 AM  Mariah Stewart  has presented today for surgery, with the diagnosis of retrocalcaneal exostosis with achilles tendonitis left foot.  The various methods of treatment have been discussed with the patient.  No guarantees were given.  After consideration of risks, benefits and other options for treatment, the patient has consented to surgery.  I have reviewed the patients' chart and labs.    Patient Vitals for the past 24 hrs:  BP Temp Temp src Pulse Resp SpO2  04/25/12 0630 144/100 mmHg 98.4 F (36.9 C) Oral 90  18  98 %    A history and physical examination was performed in my office on 04/16/2012.  The patient was reexamined.  There have been no changes to this history and physical examination.  Dallas Schimke, DPM

## 2012-04-26 ENCOUNTER — Encounter (HOSPITAL_COMMUNITY): Payer: Self-pay | Admitting: Podiatry

## 2013-03-20 ENCOUNTER — Other Ambulatory Visit (HOSPITAL_COMMUNITY): Payer: Self-pay | Admitting: Pulmonary Disease

## 2013-03-20 DIAGNOSIS — Z139 Encounter for screening, unspecified: Secondary | ICD-10-CM

## 2013-03-25 ENCOUNTER — Ambulatory Visit (HOSPITAL_COMMUNITY)
Admission: RE | Admit: 2013-03-25 | Discharge: 2013-03-25 | Disposition: A | Discharge status: Home / Self Care | Payer: BC Managed Care – PPO | Source: Ambulatory Visit | Attending: Pulmonary Disease | Admitting: Pulmonary Disease

## 2013-03-25 DIAGNOSIS — Z139 Encounter for screening, unspecified: Secondary | ICD-10-CM

## 2013-03-25 DIAGNOSIS — Z1231 Encounter for screening mammogram for malignant neoplasm of breast: Secondary | ICD-10-CM | POA: Insufficient documentation

## 2013-08-25 ENCOUNTER — Encounter (HOSPITAL_COMMUNITY): Payer: Self-pay | Admitting: Pharmacy Technician

## 2013-08-29 NOTE — Patient Instructions (Signed)
FATEN FRIESON  08/29/2013   Your procedure is scheduled on:  Monday,September 08, 2013  Report to [facility] at [place] AM.  Call this number if you have problems the morning of surgery: [phone]   Remember:   Do not eat food or drink liquids after midnight.   Take these medicines the morning of surgery with A SIP OF WATER: Levothyroxine, Lisinopril-HCTZ,Losartan, Protonix   Do not wear jewelry, make-up or nail polish.  Do not wear lotions, powders, or perfumes. You may wear deodorant.  Do not shave 48 hours prior to surgery. Men may shave face and neck.  Do not bring valuables to the hospital.  [facility] is not responsible for any belongings or valuables.               Contacts, dentures or bridgework may not be worn into surgery.  Leave suitcase in the car. After surgery it may be brought to your room.  For patients admitted to the hospital, discharge time is determined by your treatment team.               Patients discharged the day of surgery will not be allowed to drive  home.  Name and phone number of your driver: Family or friend  Special Instructions: N/A   Please read over the following fact sheets that you were given: Pain Booklet, Coughing and Deep Breathing, MRSA Information, Surgical Site Infection Prevention, Anesthesia Post-op Instructions and Care and Recovery After Surgery  PATIENT INSTRUCTIONS POST-ANESTHESIA  IMMEDIATELY FOLLOWING SURGERY:  Do not drive or operate machinery for the first twenty four hours after surgery.  Do not make any important decisions for twenty four hours after surgery or while taking narcotic pain medications or sedatives.  If you develop intractable nausea and vomiting or a severe headache please notify your doctor immediately.  FOLLOW-UP:  Please make an appointment with your surgeon as instructed. You do not need to follow up with anesthesia unless specifically instructed to do so.  WOUND CARE INSTRUCTIONS (if applicable):  Keep a dry  clean dressing on the anesthesia/puncture wound site if there is drainage.  Once the wound has quit draining you may leave it open to air.  Generally you should leave the bandage intact for twenty four hours unless there is drainage.  If the epidural site drains for more than 36-48 hours please call the anesthesia department.  QUESTIONS?:  Please feel free to call your physician or the hospital operator if you have any questions, and they will be happy to assist you.     Cataract Surgery  A cataract is a clouding of the lens of the eye. When a lens becomes cloudy, vision is reduced based on the degree and nature of the clouding. Surgery may be needed to improve vision. Surgery removes the cloudy lens and usually replaces it with a substitute lens (intraocular lens, IOL). LET YOUR EYE DOCTOR KNOW ABOUT:  Allergies to food or medicine.  Medicines taken including herbs, eyedrops, over-the-counter medicines, and creams.  Use of steroids (by mouth or creams).  Previous problems with anesthetics or numbing medicine.  History of bleeding problems or blood clots.  Previous surgery.  Other health problems, including diabetes and kidney problems.  Possibility of pregnancy, if this applies. RISKS AND COMPLICATIONS  Infection.  Inflammation of the eyeball (endophthalmitis) that can spread to both eyes (sympathetic ophthalmia).  Poor wound healing.  If an IOL is inserted, it can later fall out of proper position. This is  very uncommon.  Clouding of the part of your eye that holds an IOL in place. This is called an "after-cataract." These are uncommon, but easily treated. BEFORE THE PROCEDURE  Do not eat or drink anything except small amounts of water for 8 to 12 before your surgery, or as directed by your caregiver.  Unless you are told otherwise, continue any eyedrops you have been prescribed.  Talk to your primary caregiver about all other medicines that you take (both prescription and  non-prescription). In some cases, you may need to stop or change medicines near the time of your surgery. This is most important if you are taking blood-thinning medicine.Do not stop medicines unless you are told to do so.  Arrange for someone to drive you to and from the procedure.  Do not put contact lenses in either eye on the day of your surgery. PROCEDURE There is more than one method for safely removing a cataract. Your doctor can explain the differences and help determine which is best for you. Phacoemulsification surgery is the most common form of cataract surgery.  An injection is given behind the eye or eyedrops are given to make this a painless procedure.  A small cut (incision) is made on the edge of the clear, dome-shaped surface that covers the front of the eye (cornea).  A tiny probe is painlessly inserted into the eye. This device gives off ultrasound waves that soften and break up the cloudy center of the lens. This makes it easier for the cloudy lens to be removed by suction.  An IOL may be implanted.  The normal lens of the eye is covered by a clear capsule. Part of that capsule is intentionally left in the eye to support the IOL.  Your surgeon may or may not use stitches to close the incision. There are other forms of cataract surgery that require a larger incision and stiches to close the eye. This approach is taken in cases where the doctor feels that the cataract cannot be easily removed using phacoemulsification. AFTER THE PROCEDURE  When an IOL is implanted, it does not need care. It becomes a permanent part of your eye and cannot be seen or felt.  Your doctor will schedule follow-up exams to check on your progress.  Review your other medicines with your doctor to see which can be resumed after surgery.  Use eyedrops or take medicine as prescribed by your doctor.

## 2013-09-01 ENCOUNTER — Encounter (HOSPITAL_COMMUNITY): Payer: Self-pay

## 2013-09-01 ENCOUNTER — Encounter (HOSPITAL_COMMUNITY)
Admission: RE | Admit: 2013-09-01 | Discharge: 2013-09-01 | Disposition: A | Discharge status: Home / Self Care | Payer: BC Managed Care – PPO | Source: Ambulatory Visit | Attending: Ophthalmology | Admitting: Ophthalmology

## 2013-09-01 DIAGNOSIS — Z01810 Encounter for preprocedural cardiovascular examination: Secondary | ICD-10-CM | POA: Insufficient documentation

## 2013-09-01 DIAGNOSIS — Z01812 Encounter for preprocedural laboratory examination: Secondary | ICD-10-CM | POA: Insufficient documentation

## 2013-09-01 HISTORY — DX: Unspecified osteoarthritis, unspecified site: M19.90

## 2013-09-01 LAB — BASIC METABOLIC PANEL
BUN: 13 mg/dL (ref 6–23)
CO2: 26 mEq/L (ref 19–32)
CREATININE: 0.76 mg/dL (ref 0.50–1.10)
Calcium: 9.6 mg/dL (ref 8.4–10.5)
Chloride: 99 mEq/L (ref 96–112)
GFR calc Af Amer: >90 mL/min (ref >90)
GLUCOSE: 157 mg/dL — AB (ref 70–99)
POTASSIUM: 3.5 meq/L — AB (ref 3.7–5.3)
Sodium: 139 mEq/L (ref 137–147)

## 2013-09-01 LAB — HEMOGLOBIN AND HEMATOCRIT, BLOOD
HCT: 40 % (ref 36.0–46.0)
Hemoglobin: 13 g/dL (ref 12.0–15.0)

## 2013-09-01 NOTE — Pre-Procedure Instructions (Signed)
Patient given information to sign  Up for my chart at home.

## 2013-09-05 MED ORDER — TETRACAINE HCL 0.5 % OP SOLN
OPHTHALMIC | Status: AC
  Filled 2013-09-05: qty 2

## 2013-09-05 MED ORDER — LIDOCAINE HCL (PF) 1 % IJ SOLN
INTRAMUSCULAR | Status: AC
  Filled 2013-09-05: qty 2

## 2013-09-05 MED ORDER — NEOMYCIN-POLYMYXIN-DEXAMETH 3.5-10000-0.1 OP SUSP
OPHTHALMIC | Status: AC
  Filled 2013-09-05: qty 5

## 2013-09-05 MED ORDER — LIDOCAINE HCL 3.5 % OP GEL
OPHTHALMIC | Status: AC
  Filled 2013-09-05: qty 1

## 2013-09-05 MED ORDER — CYCLOPENTOLATE-PHENYLEPHRINE OP SOLN OPTIME - NO CHARGE
OPHTHALMIC | Status: AC
  Filled 2013-09-05: qty 2

## 2013-09-05 MED ORDER — PHENYLEPHRINE HCL 2.5 % OP SOLN
OPHTHALMIC | Status: AC
  Filled 2013-09-05: qty 15

## 2013-09-08 ENCOUNTER — Encounter (HOSPITAL_COMMUNITY): Payer: Self-pay | Admitting: *Deleted

## 2013-09-08 ENCOUNTER — Ambulatory Visit (HOSPITAL_COMMUNITY): Payer: BC Managed Care – PPO | Admitting: Anesthesiology

## 2013-09-08 ENCOUNTER — Encounter (HOSPITAL_COMMUNITY): Payer: BC Managed Care – PPO | Admitting: Anesthesiology

## 2013-09-08 ENCOUNTER — Ambulatory Visit (HOSPITAL_COMMUNITY)
Admission: RE | Admit: 2013-09-08 | Discharge: 2013-09-08 | Disposition: A | Discharge status: Home / Self Care | Payer: BC Managed Care – PPO | Source: Ambulatory Visit | Attending: Ophthalmology | Admitting: Ophthalmology

## 2013-09-08 ENCOUNTER — Encounter (HOSPITAL_COMMUNITY)
Admission: RE | Disposition: A | Discharge status: Home / Self Care | Payer: Self-pay | Source: Ambulatory Visit | Attending: Ophthalmology

## 2013-09-08 DIAGNOSIS — F17200 Nicotine dependence, unspecified, uncomplicated: Secondary | ICD-10-CM | POA: Insufficient documentation

## 2013-09-08 DIAGNOSIS — K219 Gastro-esophageal reflux disease without esophagitis: Secondary | ICD-10-CM | POA: Insufficient documentation

## 2013-09-08 DIAGNOSIS — H2589 Other age-related cataract: Secondary | ICD-10-CM | POA: Insufficient documentation

## 2013-09-08 DIAGNOSIS — Z79899 Other long term (current) drug therapy: Secondary | ICD-10-CM | POA: Insufficient documentation

## 2013-09-08 DIAGNOSIS — I10 Essential (primary) hypertension: Secondary | ICD-10-CM | POA: Insufficient documentation

## 2013-09-08 HISTORY — PX: CATARACT EXTRACTION W/PHACO: SHX586

## 2013-09-08 LAB — GLUCOSE, CAPILLARY: Glucose-Capillary: 117 mg/dL — ABNORMAL HIGH (ref 70–99)

## 2013-09-08 SURGERY — PHACOEMULSIFICATION, CATARACT, WITH IOL INSERTION
Anesthesia: Monitor Anesthesia Care | Site: Eye | Laterality: Right

## 2013-09-08 MED ORDER — MIDAZOLAM HCL 2 MG/2ML IJ SOLN
INTRAMUSCULAR | Status: AC
  Filled 2013-09-08: qty 2

## 2013-09-08 MED ORDER — ONDANSETRON HCL 4 MG/2ML IJ SOLN: 4.0000 mg | Freq: Once | INTRAMUSCULAR | Status: DC | PRN

## 2013-09-08 MED ORDER — NEOMYCIN-POLYMYXIN-DEXAMETH 3.5-10000-0.1 OP SUSP
OPHTHALMIC | Status: DC | PRN
  Administered 2013-09-08: 2 [drp] via OPHTHALMIC

## 2013-09-08 MED ORDER — PROVISC 10 MG/ML IO SOLN
INTRAOCULAR | Status: DC | PRN
  Administered 2013-09-08: 0.85 mL via INTRAOCULAR

## 2013-09-08 MED ORDER — LIDOCAINE HCL 3.5 % OP GEL
1.0000 "application " | Freq: Once | OPHTHALMIC | Status: AC
  Administered 2013-09-08: 1 via OPHTHALMIC

## 2013-09-08 MED ORDER — TETRACAINE HCL 0.5 % OP SOLN
1.0000 [drp] | OPHTHALMIC | Status: AC
  Administered 2013-09-08 (×3): 1 [drp] via OPHTHALMIC

## 2013-09-08 MED ORDER — EPINEPHRINE HCL 1 MG/ML IJ SOLN
INTRAOCULAR | Status: DC | PRN
  Administered 2013-09-08: 07:00:00

## 2013-09-08 MED ORDER — POVIDONE-IODINE 5 % OP SOLN
OPHTHALMIC | Status: DC | PRN
  Administered 2013-09-08: 1 via OPHTHALMIC

## 2013-09-08 MED ORDER — BSS IO SOLN
INTRAOCULAR | Status: DC | PRN
  Administered 2013-09-08: 15 mL via INTRAOCULAR

## 2013-09-08 MED ORDER — FENTANYL CITRATE 0.05 MG/ML IJ SOLN
INTRAMUSCULAR | Status: AC
  Filled 2013-09-08: qty 2

## 2013-09-08 MED ORDER — MIDAZOLAM HCL 2 MG/2ML IJ SOLN
1.0000 mg | INTRAMUSCULAR | Status: DC | PRN
  Administered 2013-09-08: 2 mg via INTRAVENOUS

## 2013-09-08 MED ORDER — EPINEPHRINE HCL 1 MG/ML IJ SOLN
INTRAMUSCULAR | Status: AC
  Filled 2013-09-08: qty 1

## 2013-09-08 MED ORDER — LACTATED RINGERS IV SOLN
INTRAVENOUS | Status: DC
  Administered 2013-09-08: 1000 mL via INTRAVENOUS

## 2013-09-08 MED ORDER — MIDAZOLAM HCL 5 MG/5ML IJ SOLN
INTRAMUSCULAR | Status: DC | PRN
  Administered 2013-09-08: 1 mg via INTRAVENOUS

## 2013-09-08 MED ORDER — FENTANYL CITRATE 0.05 MG/ML IJ SOLN
25.0000 ug | INTRAMUSCULAR | Status: AC
  Administered 2013-09-08 (×2): 25 ug via INTRAVENOUS

## 2013-09-08 MED ORDER — PHENYLEPHRINE HCL 2.5 % OP SOLN
1.0000 [drp] | OPHTHALMIC | Status: AC
  Administered 2013-09-08 (×3): 1 [drp] via OPHTHALMIC

## 2013-09-08 MED ORDER — CYCLOPENTOLATE-PHENYLEPHRINE 0.2-1 % OP SOLN
1.0000 [drp] | OPHTHALMIC | Status: AC
  Administered 2013-09-08 (×3): 1 [drp] via OPHTHALMIC

## 2013-09-08 MED ORDER — LIDOCAINE HCL (PF) 1 % IJ SOLN
INTRAMUSCULAR | Status: DC | PRN
  Administered 2013-09-08: 1 mL

## 2013-09-08 MED ORDER — LIDOCAINE 3.5 % OP GEL OPTIME - NO CHARGE
OPHTHALMIC | Status: DC | PRN
  Administered 2013-09-08: 2 [drp] via OPHTHALMIC

## 2013-09-08 MED ORDER — FENTANYL CITRATE 0.05 MG/ML IJ SOLN: 25.0000 ug | INTRAMUSCULAR | Status: DC | PRN

## 2013-09-08 SURGICAL SUPPLY — 33 items
CAPSULAR TENSION RING-AMO (OPHTHALMIC RELATED) IMPLANT
CLOTH BEACON ORANGE TIMEOUT ST (SAFETY) ×1 IMPLANT
EYE SHIELD UNIVERSAL CLEAR (GAUZE/BANDAGES/DRESSINGS) ×1 IMPLANT
GLOVE BIO SURGEON STRL SZ 6.5 (GLOVE) ×1 IMPLANT
GLOVE BIOGEL PI IND STRL 6.5 (GLOVE) IMPLANT
GLOVE BIOGEL PI IND STRL 7.0 (GLOVE) IMPLANT
GLOVE BIOGEL PI IND STRL 7.5 (GLOVE) IMPLANT
GLOVE BIOGEL PI INDICATOR 6.5 (GLOVE)
GLOVE BIOGEL PI INDICATOR 7.0 (GLOVE)
GLOVE BIOGEL PI INDICATOR 7.5 (GLOVE)
GLOVE ECLIPSE 6.5 STRL STRAW (GLOVE) IMPLANT
GLOVE ECLIPSE 7.0 STRL STRAW (GLOVE) IMPLANT
GLOVE ECLIPSE 7.5 STRL STRAW (GLOVE) IMPLANT
GLOVE EXAM NITRILE LRG STRL (GLOVE) IMPLANT
GLOVE EXAM NITRILE MD LF STRL (GLOVE) IMPLANT
GLOVE SKINSENSE NS SZ6.5 (GLOVE)
GLOVE SKINSENSE NS SZ7.0 (GLOVE)
GLOVE SKINSENSE STRL SZ6.5 (GLOVE) IMPLANT
GLOVE SKINSENSE STRL SZ7.0 (GLOVE) IMPLANT
GLOVE SS N UNI LF 7.0 STRL (GLOVE) ×1 IMPLANT
KIT VITRECTOMY (OPHTHALMIC RELATED) IMPLANT
PAD ARMBOARD 7.5X6 YLW CONV (MISCELLANEOUS) ×1 IMPLANT
PROC W NO LENS (INTRAOCULAR LENS)
PROC W SPEC LENS (INTRAOCULAR LENS)
PROCESS W NO LENS (INTRAOCULAR LENS) IMPLANT
PROCESS W SPEC LENS (INTRAOCULAR LENS) IMPLANT
RING MALYGIN (MISCELLANEOUS) IMPLANT
SIGHTPATH CAT PROC W REG LENS (Ophthalmic Related) ×2 IMPLANT
SYR TB 1ML LL NO SAFETY (SYRINGE) ×1 IMPLANT
TAPE SURG TRANSPORE 1 IN (GAUZE/BANDAGES/DRESSINGS) IMPLANT
TAPE SURGICAL TRANSPORE 1 IN (GAUZE/BANDAGES/DRESSINGS) ×1
VISCOELASTIC ADDITIONAL (OPHTHALMIC RELATED) IMPLANT
WATER STERILE IRR 250ML POUR (IV SOLUTION) ×1 IMPLANT

## 2013-09-08 NOTE — Anesthesia Procedure Notes (Signed)
Procedure Name: MAC Date/Time: 09/08/2013 7:23 AM Performed by: Vista Deck Pre-anesthesia Checklist: Patient identified, Emergency Drugs available, Suction available, Timeout performed and Patient being monitored Patient Re-evaluated:Patient Re-evaluated prior to inductionOxygen Delivery Method: Nasal Cannula

## 2013-09-08 NOTE — Discharge Instructions (Signed)

## 2013-09-08 NOTE — H&P (Signed)
I have reviewed the H&P, the patient was re-examined, and I have identified no interval changes in medical condition and plan of care since the history and physical of record  

## 2013-09-08 NOTE — Anesthesia Preprocedure Evaluation (Signed)
Anesthesia Evaluation  Patient identified by MRN, date of birth, ID band Patient awake    Reviewed: Allergy & Precautions, H&P , NPO status , Patient's Chart, lab work & pertinent test results  Airway Mallampati: III TM Distance: >3 FB   Mouth opening: Limited Mouth Opening  Dental  (+) Teeth Intact   Pulmonary sleep apnea , Current Smoker,  breath sounds clear to auscultation        Cardiovascular hypertension, Pt. on medications + CAD Rhythm:Regular Rate:Normal     Neuro/Psych    GI/Hepatic GERD-  Medicated and Controlled,  Endo/Other  Hypothyroidism   Renal/GU      Musculoskeletal   Abdominal   Peds  Hematology   Anesthesia Other Findings   Reproductive/Obstetrics                           Anesthesia Physical Anesthesia Plan  ASA: III  Anesthesia Plan: MAC   Post-op Pain Management:    Induction: Intravenous  Airway Management Planned: Nasal Cannula  Additional Equipment:   Intra-op Plan:   Post-operative Plan:   Informed Consent: I have reviewed the patients History and Physical, chart, labs and discussed the procedure including the risks, benefits and alternatives for the proposed anesthesia with the patient or authorized representative who has indicated his/her understanding and acceptance.     Plan Discussed with:   Anesthesia Plan Comments:         Anesthesia Quick Evaluation

## 2013-09-08 NOTE — Op Note (Signed)
Date of Admission: 09/08/2013  Date of Surgery: 09/08/2013   Pre-Op Dx: Cataract Right Eye  Post-Op Dx: Combined Cataract Right  Eye,  Dx Code 366.19  Surgeon: Tonny Branch, M.D.  Assistants: None  Anesthesia: Topical with MAC  Indications: Painless, progressive loss of vision with compromise of daily activities.  Surgery: Cataract Extraction with Intraocular lens Implant Right Eye  Discription: The patient had dilating drops and viscous lidocaine placed into the Right eye in the pre-op holding area. After transfer to the operating room, a time out was performed. The patient was then prepped and draped. Beginning with a 44 degree blade a paracentesis port was made at the surgeon's 2 o'clock position. The anterior chamber was then filled with 1% non-preserved lidocaine. This was followed by filling the anterior chamber with Provisc.  A 2.54mm keratome blade was used to make a clear corneal incision at the temporal limbus.  A bent cystatome needle was used to create a continuous tear capsulotomy. Hydrodissection was performed with balanced salt solution on a Fine canula. The lens nucleus was then removed using the phacoemulsification handpiece. Residual cortex was removed with the I&A handpiece. The anterior chamber and capsular bag were refilled with Provisc. A posterior chamber intraocular lens was placed into the capsular bag with it's injector. The implant was positioned with the Kuglan hook. The Provisc was then removed from the anterior chamber and capsular bag with the I&A handpiece. Stromal hydration of the main incision and paracentesis port was performed with BSS on a Fine canula. The wounds were tested for leak which was negative. The patient tolerated the procedure well. There were no operative complications. The patient was then transferred to the recovery room in stable condition.  Complications: None  Specimen: None  EBL: None  Prosthetic device: Hoya iSert 250, power 19.5 D, SN  [id number].

## 2013-09-08 NOTE — Anesthesia Postprocedure Evaluation (Signed)
  Anesthesia Post-op Note  Patient: Mariah Stewart  Procedure(s) Performed: Procedure(s) (LRB): CATARACT EXTRACTION PHACO AND INTRAOCULAR LENS PLACEMENT (IOC) (Right)  Patient Location:  Short Stay  Anesthesia Type: MAC  Level of Consciousness: awake  Airway and Oxygen Therapy: Patient Spontanous Breathing  Post-op Pain: none  Post-op Assessment: Post-op Vital signs reviewed, Patient's Cardiovascular Status Stable, Respiratory Function Stable, Patent Airway, No signs of Nausea or vomiting and Pain level controlled  Post-op Vital Signs: Reviewed and stable  Complications: No apparent anesthesia complications

## 2013-09-08 NOTE — Transfer of Care (Signed)
Immediate Anesthesia Transfer of Care Note  Patient: Mariah Stewart  Procedure(s) Performed: Procedure(s) (LRB): CATARACT EXTRACTION PHACO AND INTRAOCULAR LENS PLACEMENT (IOC) (Right)  Patient Location: Shortstay  Anesthesia Type: MAC  Level of Consciousness: awake  Airway & Oxygen Therapy: Patient Spontanous Breathing   Post-op Assessment: Report given to PACU RN, Post -op Vital signs reviewed and stable and Patient moving all extremities  Post vital signs: Reviewed and stable  Complications: No apparent anesthesia complications

## 2013-09-09 ENCOUNTER — Encounter (HOSPITAL_COMMUNITY): Payer: Self-pay | Admitting: Ophthalmology

## 2014-04-02 ENCOUNTER — Other Ambulatory Visit (HOSPITAL_COMMUNITY): Payer: Self-pay | Admitting: Pulmonary Disease

## 2014-04-02 DIAGNOSIS — Z1231 Encounter for screening mammogram for malignant neoplasm of breast: Secondary | ICD-10-CM

## 2014-04-17 ENCOUNTER — Ambulatory Visit (HOSPITAL_COMMUNITY): Payer: BC Managed Care – PPO

## 2014-05-04 ENCOUNTER — Ambulatory Visit (HOSPITAL_COMMUNITY)
Admission: RE | Admit: 2014-05-04 | Discharge: 2014-05-04 | Disposition: A | Discharge status: Home / Self Care | Payer: BC Managed Care – PPO | Source: Ambulatory Visit | Attending: Pulmonary Disease | Admitting: Pulmonary Disease

## 2014-05-04 DIAGNOSIS — Z1231 Encounter for screening mammogram for malignant neoplasm of breast: Secondary | ICD-10-CM | POA: Insufficient documentation

## 2014-06-08 ENCOUNTER — Telehealth: Payer: Self-pay | Admitting: *Deleted

## 2014-06-08 ENCOUNTER — Encounter: Payer: Self-pay | Admitting: Internal Medicine

## 2014-06-08 NOTE — Telephone Encounter (Signed)
DR HAWKINS OFFICE CALLED STATING THAT THE PATIENT IS IN THE OFFICE AND THEY WANT TO KNOW WHEN SHE IS DUE FOR HER COLONOSCOPY,.  SHE IS NOT ON A RECALL AND THE RESULT LETTER DID NOT SPECIFY AS TO WHEN SHE NEEDED TO RETURN FOR ANOTHER PROCEDURE.  PLEASE ADVISE

## 2014-06-08 NOTE — Telephone Encounter (Signed)
APPOINTMENT MADE AND LETTER SENT °

## 2014-06-08 NOTE — Telephone Encounter (Signed)
Please see the phone notes from 2013. Pt was due to return for an office visit and schedule tcs then. She needs an office visit soon.

## 2014-06-22 ENCOUNTER — Encounter: Payer: Self-pay | Admitting: Internal Medicine

## 2014-06-25 ENCOUNTER — Ambulatory Visit: Payer: Self-pay | Admitting: Gastroenterology

## 2014-12-14 ENCOUNTER — Ambulatory Visit: Payer: Self-pay | Admitting: Gastroenterology

## 2015-01-11 ENCOUNTER — Other Ambulatory Visit: Payer: Self-pay

## 2015-01-11 ENCOUNTER — Encounter: Payer: Self-pay | Admitting: Gastroenterology

## 2015-01-11 ENCOUNTER — Ambulatory Visit (INDEPENDENT_AMBULATORY_CARE_PROVIDER_SITE_OTHER): Payer: BLUE CROSS/BLUE SHIELD | Admitting: Gastroenterology

## 2015-01-11 VITALS — BP 180/110 | HR 75 | Temp 98.6°F | Ht 63.0 in | Wt 264.6 lb

## 2015-01-11 DIAGNOSIS — Z800 Family history of malignant neoplasm of digestive organs: Secondary | ICD-10-CM | POA: Diagnosis not present

## 2015-01-11 DIAGNOSIS — Z86010 Personal history of colonic polyps: Secondary | ICD-10-CM

## 2015-01-11 DIAGNOSIS — D126 Benign neoplasm of colon, unspecified: Secondary | ICD-10-CM

## 2015-01-11 NOTE — Patient Instructions (Signed)
We have scheduled you for a colonoscopy with Dr. Gala Romney.  I would like for you to have an extra 1/2 day of clear liquids.

## 2015-01-11 NOTE — Progress Notes (Signed)
Referring Provider: Sinda Du, MD Primary Care Physician:  Alonza Bogus, MD  Primary GI: Dr. Gala Romney   Chief Complaint  Patient presents with  . set up TCS    HPI:   Mariah Stewart is a 59 y.o. female presenting today with a personal history of adenomas and family history of colon cancer in mother, diagnosed in her 74s. Last colonoscopy in 2013 incomplete and unable to reach the cecum despite multiple maneuvers. Barium enema incomplete. She is here for early surveillance. Suboptimal prep noted on last exam.   Denies abdominal pain, rectal bleeding, N/V, upper GI symptoms, weight loss, lack of appetite. In last few months has had to take a stool softener.   Past Medical History  Diagnosis Date  . Hypertension   . Hypercholesterolemia   . GERD (gastroesophageal reflux disease)   . CAD (coronary artery disease)   . Hypothyroidism   . Hx of adenomatous colonic polyps   . Sleep apnea     pt doesn't wear her CPAP machine-PCP knows patient unable to wear CPAP  . Arthritis     Past Surgical History  Procedure Laterality Date  . Colonoscopy  04/16/07    diminutive rectal polyp removed, scattered diverticula, path: tubular adenoma  . Colonoscopy  04/14/02    external hemorrhoids otherwise normal  . S/p hysterectomy    . Coronary artery bypass graft  2000    triple bypass  . Cholecystectomy    . Abdominal hysterectomy    . Colonoscopy  05/24/2011    Dr. Rourk:tubular adenoma and hyperplastic polyp, long redundant colon/left sided diverticulosis/internal hemorrhoids, incomplete examination, unable to reach the cecum despite external abdominal pressure, changing position. Barium enema incomplete.   . Bone exostosis excision  04/25/2012    Procedure: EXOSTOSIS EXCISION;  Surgeon: Marcheta Grammes, DPM;  Location: [facility];  Service: Orthopedics;  Laterality: Left;  Retrocalcaneal Exostectomy Left Foot  . Cataract extraction w/phaco Right 09/08/2013    Procedure: CATARACT  EXTRACTION PHACO AND INTRAOCULAR LENS PLACEMENT (IOC);  Surgeon: Tonny Branch, MD;  Location: [facility];  Service: Ophthalmology;  Laterality: Right;  CDE 9.13    Current Outpatient Prescriptions  Medication Sig Dispense Refill  . aspirin 81 MG tablet Take 81 mg by mouth daily.      Marland Kitchen EST ESTROGENS-METHYLTEST HS 0.625-1.25 MG per tablet Take 1 tablet by mouth daily.     Marland Kitchen levothyroxine (SYNTHROID, LEVOTHROID) 175 MCG tablet Take 175 mcg by mouth daily.     Marland Kitchen losartan (COZAAR) 100 MG tablet Take 100 mg by mouth daily.     . naproxen (NAPROSYN) 500 MG tablet Take 500 mg by mouth daily.    . pantoprazole (PROTONIX) 40 MG tablet Take 40 mg by mouth daily.     . verapamil (VERELAN PM) 360 MG 24 hr capsule Take 360 mg by mouth at bedtime.     No current facility-administered medications for this visit.    Allergies as of 01/11/2015  . (No Known Allergies)    Family History  Problem Relation Age of Onset  . Colon cancer Mother     diagnosed at age 46, passed away at age 41 from metastasis  . Heart attack Father   . Prostate cancer Brother   . COPD Brother     Social History   Social History  . Marital Status: Single    Spouse Name: N/A  . Number of Children: 0  . Years of Education: N/A   Occupational History  .  full-time [employer]   Social History Main Topics  . Smoking status: Current Every Day Smoker -- 0.50 packs/day for 15 years    Types: Cigarettes  . Smokeless tobacco: None  . Alcohol Use: No  . Drug Use: No  . Sexual Activity: Yes    Birth Control/ Protection: Surgical   Other Topics Concern  . None   Social History Narrative    Review of Systems: Gen: Denies fever, chills, anorexia. Denies fatigue, weakness, weight loss.  CV: Denies chest pain, palpitations, syncope, peripheral edema, and claudication. Resp: Denies dyspnea at rest, cough, wheezing, coughing up blood, and pleurisy. GI: see HPI Derm: Denies rash, itching, dry skin Psych: Denies  depression, anxiety, memory loss, confusion. No homicidal or suicidal ideation.  Heme: Denies bruising, bleeding, and enlarged lymph nodes.  Physical Exam: BP 180/110 mmHg  Pulse 75  Temp(Src) 98.6 F (37 C)  Ht 5\' 3"  (1.6 m)  Wt 264 lb 9.6 oz (120.022 kg)  BMI 46.88 kg/m2 General:   Alert and oriented. No distress noted. Pleasant and cooperative.  Head:  Normocephalic and atraumatic. Eyes:  Conjuctiva clear without scleral icterus. Mouth:  Oral mucosa pink and moist. Good dentition. No lesions. Heart:  S1, S2 present without murmurs, rubs, or gallops. Regular rate and rhythm. Abdomen:  +BS, soft, obese, non-tender and non-distended. No rebound or guarding. No HSM or masses noted. Msk:  Symmetrical without gross deformities. Normal posture. Extremities:  Without edema. Neurologic:  Alert and  oriented x4;  grossly normal neurologically. Psych:  Alert and cooperative. Normal mood and affect.

## 2015-01-14 ENCOUNTER — Encounter: Payer: Self-pay | Admitting: Gastroenterology

## 2015-01-17 ENCOUNTER — Encounter: Payer: Self-pay | Admitting: Gastroenterology

## 2015-01-17 NOTE — Assessment & Plan Note (Signed)
59 year old female with personal history of adenomas and positive family history of colon cancer in first degree relative, now due for early surveillance due to incomplete colonoscopy in 2013. Suboptimal prep at that time. Barium enema unsuccessful. Needs extra day of clear liquids and split prep dosing for colonoscopy. Discussed importance of following prep explicitly.   Proceed with TCS with Dr. Gala Romney in near future: the risks, benefits, and alternatives have been discussed with the patient in detail. The patient states understanding and desires to proceed.

## 2015-01-19 NOTE — Progress Notes (Signed)
CC'ED TO PCP 

## 2015-02-05 ENCOUNTER — Ambulatory Visit (HOSPITAL_COMMUNITY)
Admission: RE | Admit: 2015-02-05 | Discharge: 2015-02-05 | Disposition: A | Discharge status: Home / Self Care | Payer: BLUE CROSS/BLUE SHIELD | Source: Ambulatory Visit | Attending: Internal Medicine | Admitting: Internal Medicine

## 2015-02-05 ENCOUNTER — Encounter (HOSPITAL_COMMUNITY): Payer: Self-pay | Admitting: *Deleted

## 2015-02-05 ENCOUNTER — Encounter (HOSPITAL_COMMUNITY)
Admission: RE | Disposition: A | Discharge status: Home / Self Care | Payer: Self-pay | Source: Ambulatory Visit | Attending: Internal Medicine

## 2015-02-05 DIAGNOSIS — Z7982 Long term (current) use of aspirin: Secondary | ICD-10-CM | POA: Insufficient documentation

## 2015-02-05 DIAGNOSIS — G4730 Sleep apnea, unspecified: Secondary | ICD-10-CM | POA: Insufficient documentation

## 2015-02-05 DIAGNOSIS — Z860100 Personal history of colon polyps, unspecified: Secondary | ICD-10-CM | POA: Insufficient documentation

## 2015-02-05 DIAGNOSIS — K5730 Diverticulosis of large intestine without perforation or abscess without bleeding: Secondary | ICD-10-CM | POA: Diagnosis not present

## 2015-02-05 DIAGNOSIS — E780 Pure hypercholesterolemia: Secondary | ICD-10-CM | POA: Insufficient documentation

## 2015-02-05 DIAGNOSIS — M1990 Unspecified osteoarthritis, unspecified site: Secondary | ICD-10-CM | POA: Insufficient documentation

## 2015-02-05 DIAGNOSIS — I10 Essential (primary) hypertension: Secondary | ICD-10-CM | POA: Insufficient documentation

## 2015-02-05 DIAGNOSIS — Q438 Other specified congenital malformations of intestine: Secondary | ICD-10-CM | POA: Diagnosis not present

## 2015-02-05 DIAGNOSIS — E039 Hypothyroidism, unspecified: Secondary | ICD-10-CM | POA: Insufficient documentation

## 2015-02-05 DIAGNOSIS — K219 Gastro-esophageal reflux disease without esophagitis: Secondary | ICD-10-CM | POA: Insufficient documentation

## 2015-02-05 DIAGNOSIS — I2510 Atherosclerotic heart disease of native coronary artery without angina pectoris: Secondary | ICD-10-CM | POA: Diagnosis not present

## 2015-02-05 DIAGNOSIS — Z791 Long term (current) use of non-steroidal anti-inflammatories (NSAID): Secondary | ICD-10-CM | POA: Diagnosis not present

## 2015-02-05 DIAGNOSIS — Z800 Family history of malignant neoplasm of digestive organs: Secondary | ICD-10-CM | POA: Diagnosis not present

## 2015-02-05 DIAGNOSIS — F17210 Nicotine dependence, cigarettes, uncomplicated: Secondary | ICD-10-CM | POA: Diagnosis not present

## 2015-02-05 DIAGNOSIS — Z951 Presence of aortocoronary bypass graft: Secondary | ICD-10-CM | POA: Insufficient documentation

## 2015-02-05 DIAGNOSIS — Z1211 Encounter for screening for malignant neoplasm of colon: Secondary | ICD-10-CM | POA: Insufficient documentation

## 2015-02-05 HISTORY — PX: COLONOSCOPY: SHX5424

## 2015-02-05 LAB — HM COLONOSCOPY

## 2015-02-05 SURGERY — COLONOSCOPY
Anesthesia: Moderate Sedation

## 2015-02-05 MED ORDER — MIDAZOLAM HCL 5 MG/5ML IJ SOLN
INTRAMUSCULAR | Status: AC
  Filled 2015-02-05: qty 10

## 2015-02-05 MED ORDER — MEPERIDINE HCL 100 MG/ML IJ SOLN
INTRAMUSCULAR | Status: DC | PRN
  Administered 2015-02-05: 50 mg via INTRAVENOUS
  Administered 2015-02-05 (×2): 25 mg via INTRAVENOUS

## 2015-02-05 MED ORDER — ONDANSETRON HCL 4 MG/2ML IJ SOLN
INTRAMUSCULAR | Status: DC | PRN
  Administered 2015-02-05: 4 mg via INTRAVENOUS

## 2015-02-05 MED ORDER — STERILE WATER FOR IRRIGATION IR SOLN
Status: DC | PRN
  Administered 2015-02-05: 11:00:00

## 2015-02-05 MED ORDER — ONDANSETRON HCL 4 MG/2ML IJ SOLN
INTRAMUSCULAR | Status: AC
  Filled 2015-02-05: qty 2

## 2015-02-05 MED ORDER — MEPERIDINE HCL 100 MG/ML IJ SOLN
INTRAMUSCULAR | Status: AC
  Filled 2015-02-05: qty 2

## 2015-02-05 MED ORDER — SODIUM CHLORIDE 0.9 % IV SOLN
INTRAVENOUS | Status: DC
  Administered 2015-02-05: 11:00:00 via INTRAVENOUS

## 2015-02-05 MED ORDER — MIDAZOLAM HCL 5 MG/5ML IJ SOLN
INTRAMUSCULAR | Status: DC | PRN
  Administered 2015-02-05 (×2): 2 mg via INTRAVENOUS
  Administered 2015-02-05 (×5): 1 mg via INTRAVENOUS

## 2015-02-05 NOTE — Op Note (Signed)
[place], [id number]   COLONOSCOPY PROCEDURE REPORT  PATIENT: Mariah Stewart, Mariah Stewart  MR#: [id number] BIRTHDATE: 1955-11-16 , 58  yrs. old GENDER: female ENDOSCOPIST: R.  Garfield Cornea, MD FACP [facility] REFERRED GM:WNUUVO Luan Pulling, M.D. PROCEDURE DATE:  February 28, 2015 PROCEDURE:   Colonoscopy, surveillance (incomplete) INDICATIONS:History of colonic adenoma. MEDICATIONS: Versed 9 mg IV and Demerol 100 mg IV in divided doses. Zofran 4 mg IV ASA CLASS:       Class III  CONSENT: The risks, benefits, alternatives and imponderables including but not limited to bleeding, perforation as well as the possibility of a missed lesion have been reviewed.  The potential for biopsy, lesion removal, etc. have also been discussed. Questions have been answered.  All parties agreeable.  Please see the history and physical in the medical record for more information.  DESCRIPTION OF PROCEDURE:   After the risks benefits and alternatives of the procedure were thoroughly explained, informed consent was obtained.  The digital rectal exam revealed no abnormalities of the rectum.   The EC-[id number] ([id number])  endoscope was introduced through the anus and advanced to the ascending colon. No adverse events experienced.   The quality of the prep was adequate  The instrument was then slowly withdrawn as the colon was fully examined. Estimated blood loss is zero unless otherwise noted in this procedure report.      COLON FINDINGS: Normal-appearing rectal mucosa.  Markedly redundant, elongated colon.  With external abdominal pressure change can the patient's position fairly easily mated to the mid ascending segment.  I couldn't see the ileocecal valve approximately 18-20 cm away.  However, in spite of external abdominal pressure changing the patient's position I could not reach him deeply intubate the cecum.  The colonic mucosa that was seen appeared normal  aside from scattered  diverticula..  Retroflexion was  not performed performed  because rectal vault was small. I was able see the rectal mucosa very well on?"face.. .  Withdrawal time= not applicable     .  The scope was withdrawn and the procedure completed. COMPLICATIONS: There were no immediate complications.  ENDOSCOPIC IMPRESSION: Elongated, redundant colon?"incomplete as described above . Colonic diverticulosis.  RECOMMENDATIONS: Air-contrast barium enema to image the cecum in about about 1-2 weeks from now. Further recommendations to follow.  eSigned:  R. Garfield Cornea, MD Rosalita Chessman [facility] 02/28/15 12:42 PM   cc:  CPT CODES: ICD CODES:  The ICD and CPT codes recommended by this software are interpretations from the data that the clinical staff has captured with the software.  The verification of the translation of this report to the ICD and CPT codes and modifiers is the sole responsibility of the health care institution and practicing physician where this report was generated.  [employer] will not be held responsible for the validity of the ICD and CPT codes included on this report.  AMA assumes no liability for data contained or not contained herein. CPT is a Designer, television/film set of the [employer].

## 2015-02-05 NOTE — Interval H&P Note (Signed)
History and Physical Interval Note:  02/05/2015 11:00 AM  Mariah Stewart  has presented today for surgery, with the diagnosis of history of polyps  The various methods of treatment have been discussed with the patient and family. After consideration of risks, benefits and other options for treatment, the patient has consented to  Procedure(s) with comments: COLONOSCOPY (N/A) - 115 - moved to 12:15 - office to notify  as a surgical intervention .  The patient's history has been reviewed, patient examined, no change in status, stable for surgery.  I have reviewed the patient's chart and labs.  Questions were answered to the patient's satisfaction.     Robert Rourk  No change. Surveillance colonoscopy per plan.  The risks, benefits, limitations, alternatives and imponderables have been reviewed with the patient. Questions have been answered. All parties are agreeable.

## 2015-02-05 NOTE — H&P (View-Only) (Signed)
  Referring Provider: Hawkins, Edward, MD Primary Care Physician:  HAWKINS,EDWARD L, MD  Primary GI: Dr. Rourk   Chief Complaint  Patient presents with  . set up TCS    HPI:   Mariah Stewart is a 58 y.o. female presenting today with a personal history of adenomas and family history of colon cancer in mother, diagnosed in her 70s. Last colonoscopy in 2013 incomplete and unable to reach the cecum despite multiple maneuvers. Barium enema incomplete. She is here for early surveillance. Suboptimal prep noted on last exam.   Denies abdominal pain, rectal bleeding, N/V, upper GI symptoms, weight loss, lack of appetite. In last few months has had to take a stool softener.   Past Medical History  Diagnosis Date  . Hypertension   . Hypercholesterolemia   . GERD (gastroesophageal reflux disease)   . CAD (coronary artery disease)   . Hypothyroidism   . Hx of adenomatous colonic polyps   . Sleep apnea     pt doesn't wear her CPAP machine-PCP knows patient unable to wear CPAP  . Arthritis     Past Surgical History  Procedure Laterality Date  . Colonoscopy  04/16/07    diminutive rectal polyp removed, scattered diverticula, path: tubular adenoma  . Colonoscopy  04/14/02    external hemorrhoids otherwise normal  . S/p hysterectomy    . Coronary artery bypass graft  2000    triple bypass  . Cholecystectomy    . Abdominal hysterectomy    . Colonoscopy  05/24/2011    Dr. Rourk:tubular adenoma and hyperplastic polyp, long redundant colon/left sided diverticulosis/internal hemorrhoids, incomplete examination, unable to reach the cecum despite external abdominal pressure, changing position. Barium enema incomplete.   . Bone exostosis excision  04/25/2012    Procedure: EXOSTOSIS EXCISION;  Surgeon: Benjamin Ivan McKinney, DPM;  Location: [facility];  Service: Orthopedics;  Laterality: Left;  Retrocalcaneal Exostectomy Left Foot  . Cataract extraction w/phaco Right 09/08/2013    Procedure: CATARACT  EXTRACTION PHACO AND INTRAOCULAR LENS PLACEMENT (IOC);  Surgeon: Kerry Hunt, MD;  Location: [facility];  Service: Ophthalmology;  Laterality: Right;  CDE 9.13    Current Outpatient Prescriptions  Medication Sig Dispense Refill  . aspirin 81 MG tablet Take 81 mg by mouth daily.      . EST ESTROGENS-METHYLTEST HS 0.625-1.25 MG per tablet Take 1 tablet by mouth daily.     . levothyroxine (SYNTHROID, LEVOTHROID) 175 MCG tablet Take 175 mcg by mouth daily.     . losartan (COZAAR) 100 MG tablet Take 100 mg by mouth daily.     . naproxen (NAPROSYN) 500 MG tablet Take 500 mg by mouth daily.    . pantoprazole (PROTONIX) 40 MG tablet Take 40 mg by mouth daily.     . verapamil (VERELAN PM) 360 MG 24 hr capsule Take 360 mg by mouth at bedtime.     No current facility-administered medications for this visit.    Allergies as of 01/11/2015  . (No Known Allergies)    Family History  Problem Relation Age of Onset  . Colon cancer Mother     diagnosed at age 73, passed away at age 78 from metastasis  . Heart attack Father   . Prostate cancer Brother   . COPD Brother     Social History   Social History  . Marital Status: Single    Spouse Name: N/A  . Number of Children: 0  . Years of Education: N/A   Occupational History  .   full-time [employer]   Social History Main Topics  . Smoking status: Current Every Day Smoker -- 0.50 packs/day for 15 years    Types: Cigarettes  . Smokeless tobacco: None  . Alcohol Use: No  . Drug Use: No  . Sexual Activity: Yes    Birth Control/ Protection: Surgical   Other Topics Concern  . None   Social History Narrative    Review of Systems: Gen: Denies fever, chills, anorexia. Denies fatigue, weakness, weight loss.  CV: Denies chest pain, palpitations, syncope, peripheral edema, and claudication. Resp: Denies dyspnea at rest, cough, wheezing, coughing up blood, and pleurisy. GI: see HPI Derm: Denies rash, itching, dry skin Psych: Denies  depression, anxiety, memory loss, confusion. No homicidal or suicidal ideation.  Heme: Denies bruising, bleeding, and enlarged lymph nodes.  Physical Exam: BP 180/110 mmHg  Pulse 75  Temp(Src) 98.6 F (37 C)  Ht 5' 3" (1.6 m)  Wt 264 lb 9.6 oz (120.022 kg)  BMI 46.88 kg/m2 General:   Alert and oriented. No distress noted. Pleasant and cooperative.  Head:  Normocephalic and atraumatic. Eyes:  Conjuctiva clear without scleral icterus. Mouth:  Oral mucosa pink and moist. Good dentition. No lesions. Heart:  S1, S2 present without murmurs, rubs, or gallops. Regular rate and rhythm. Abdomen:  +BS, soft, obese, non-tender and non-distended. No rebound or guarding. No HSM or masses noted. Msk:  Symmetrical without gross deformities. Normal posture. Extremities:  Without edema. Neurologic:  Alert and  oriented x4;  grossly normal neurologically. Psych:  Alert and cooperative. Normal mood and affect.  

## 2015-02-05 NOTE — Discharge Instructions (Signed)
Colonoscopy Discharge Instructions  Read the instructions outlined below and refer to this sheet in the next few weeks. These discharge instructions provide you with general information on caring for yourself after you leave the hospital. Your doctor may also give you specific instructions. While your treatment has been planned according to the most current medical practices available, unavoidable complications occasionally occur. If you have any problems or questions after discharge, call Dr. Gala Romney at [phone]. ACTIVITY  You may resume your regular activity, but move at a slower pace for the next 24 hours.   Take frequent rest periods for the next 24 hours.   Walking will help get rid of the air and reduce the bloated feeling in your belly (abdomen).   No driving for 24 hours (because of the medicine (anesthesia) used during the test).    Do not sign any important legal documents or operate any machinery for 24 hours (because of the anesthesia used during the test).  NUTRITION  Drink plenty of fluids.   You may resume your normal diet as instructed by your doctor.   Begin with a light meal and progress to your normal diet. Heavy or fried foods are harder to digest and may make you feel sick to your stomach (nauseated).   Avoid alcoholic beverages for 24 hours or as instructed.  MEDICATIONS  You may resume your normal medications unless your doctor tells you otherwise.  WHAT YOU CAN EXPECT TODAY  Some feelings of bloating in the abdomen.   Passage of more gas than usual.   Spotting of blood in your stool or on the toilet paper.  IF YOU HAD POLYPS REMOVED DURING THE COLONOSCOPY:  No aspirin products for 7 days or as instructed.   No alcohol for 7 days or as instructed.   Eat a soft diet for the next 24 hours.  FINDING OUT THE RESULTS OF YOUR TEST Not all test results are available during your visit. If your test results are not back during the visit, make an appointment  with your caregiver to find out the results. Do not assume everything is normal if you have not heard from your caregiver or the medical facility. It is important for you to follow up on all of your test results.  SEEK IMMEDIATE MEDICAL ATTENTION IF:  You have more than a spotting of blood in your stool.   Your belly is swollen (abdominal distention).   You are nauseated or vomiting.   You have a temperature over 101.   You have abdominal pain or discomfort that is severe or gets worse throughout the day.   Most of your colon was seen and appeared normal. The very end of your colon where it hooks and your small intestine could not be seen today.  I recommend you  have an air-contrast barium enema to evaluate your cecum in about 1-2 weeks from now.   Air Contrast Barium Enema and Preparation An air contrast barium enema is a test that evaluates the large intestine, bowel, and rectum. It is then used to search for abnormal growths (polyps, tumors) or other diseases. It differs from a routine barium enema because air is also introduced into the large intestine after barium has been introduced and partially eliminated. Air acts as a contrast agent to give more detailed X-rays. The added detail is sometimes helpful when examining X-rays. The addition of air to the procedure may help the radiologist to see details not seen with the plain barium enema. It  is especially helpful in seeing detail in the lining of the colon. This procedure is also called a double contrast barium enema. LET YOUR CAREGIVER KNOW ABOUT:  Allergies.  Medications taken including herbs, eyedrops, over-the-counter medications, and creams.  Use of steroids (by mouth or creams).  Previous problems with anesthetics or numbing medicine.  Possibility of pregnancy, if this applies.  History of blood clots.  History of bleeding or blood problems.  Previous surgery.  Previous or existing colon problems.  Other health  problems. Let your technologist know if you were unable to complete the preparations for your test or unable to follow the dietary instructions. BEFORE THE PROCEDURE  Follow all instructions for test preparation. A liquid diet may be required for a few days prior to testing. No food is allowed the night before the test. Suppositories, laxatives, or an enema may be required prior to testing. Instructions will be given. Follow instructions carefully. This will clean out your colon and improve the quality of your X-rays. Arrive 60 minutes early to check in or as directed. An air contrast barium enema takes about 1 hour.  PROCEDURE  A small tube is inserted into your rectum. This has a tip on the end with a balloon that can be inflated. The balloon is used to help you retain the barium that is put into your colon prior to the X-rays. You will feel the need to go to the bathroom. However, the balloon will help prevent this while X-rays are being taken. The barium outlines the inside of the colon while X-rays are taken. During your exam you will be told to shift position and hold your breath briefly. Some gentle pressure may be applied to your belly (abdomen). All of this is done to obtain better X-rays. Following some initial X-rays, you will be allowed to go the bathroom. Air will then be put into the colon through the tube to obtain even more detailed X-rays if necessary. If you develop a strong urge to go to the bathroom, take slow deep breaths to relax. You may leave when the technologist informs you that you are through. For your comfort during the test:  Relax as much as possible.  Try to follow your technologist's instructions to speed up the test. AFTER THE PROCEDURE   You may resume normal activities.  Call your caregiver as instructed.  Drink plenty of fluids and eat enough fruits and vegetables to keep your stools soft. Your stools may appear lighter for a few days following the test.  Ask  when your test results will be ready. Make sure you get your test results. SEEK IMMEDIATE MEDICAL CARE IF:   You become lightheaded.  You faint.  You pass blood from the rectum.  You develop belly (abdominal) pain or a fever. Document Released: 04/28/2000 Document Revised: 08/26/2012 Document Reviewed: 07/15/2007 [facility] Patient Information 2015 [place], [place]. This information is not intended to replace advice given to you by your health care provider. Make sure you discuss any questions you have with your health care provider.

## 2015-02-09 ENCOUNTER — Other Ambulatory Visit: Payer: Self-pay

## 2015-02-09 DIAGNOSIS — Z1211 Encounter for screening for malignant neoplasm of colon: Secondary | ICD-10-CM

## 2015-02-09 DIAGNOSIS — Z86010 Personal history of colonic polyps: Principal | ICD-10-CM

## 2015-02-10 ENCOUNTER — Encounter (HOSPITAL_COMMUNITY): Payer: Self-pay | Admitting: Internal Medicine

## 2015-02-17 ENCOUNTER — Other Ambulatory Visit (HOSPITAL_COMMUNITY): Payer: BLUE CROSS/BLUE SHIELD

## 2015-03-22 ENCOUNTER — Inpatient Hospital Stay (HOSPITAL_COMMUNITY): Admission: RE | Admit: 2015-03-22 | Payer: BLUE CROSS/BLUE SHIELD | Source: Ambulatory Visit

## 2015-04-27 ENCOUNTER — Other Ambulatory Visit (HOSPITAL_COMMUNITY): Payer: Self-pay | Admitting: Pulmonary Disease

## 2015-04-27 DIAGNOSIS — Z1231 Encounter for screening mammogram for malignant neoplasm of breast: Secondary | ICD-10-CM

## 2015-05-06 ENCOUNTER — Ambulatory Visit (HOSPITAL_COMMUNITY)
Admission: RE | Admit: 2015-05-06 | Discharge: 2015-05-06 | Disposition: A | Discharge status: Home / Self Care | Payer: BLUE CROSS/BLUE SHIELD | Source: Ambulatory Visit | Attending: Pulmonary Disease | Admitting: Pulmonary Disease

## 2015-05-06 DIAGNOSIS — Z1231 Encounter for screening mammogram for malignant neoplasm of breast: Secondary | ICD-10-CM

## 2015-12-13 DIAGNOSIS — G4730 Sleep apnea, unspecified: Secondary | ICD-10-CM | POA: Diagnosis not present

## 2015-12-13 DIAGNOSIS — I10 Essential (primary) hypertension: Secondary | ICD-10-CM | POA: Diagnosis not present

## 2015-12-13 DIAGNOSIS — K210 Gastro-esophageal reflux disease with esophagitis: Secondary | ICD-10-CM | POA: Diagnosis not present

## 2015-12-13 DIAGNOSIS — N951 Menopausal and female climacteric states: Secondary | ICD-10-CM | POA: Diagnosis not present

## 2016-01-07 DIAGNOSIS — M79671 Pain in right foot: Secondary | ICD-10-CM | POA: Diagnosis not present

## 2016-01-07 DIAGNOSIS — M7661 Achilles tendinitis, right leg: Secondary | ICD-10-CM | POA: Diagnosis not present

## 2016-01-27 DIAGNOSIS — M25471 Effusion, right ankle: Secondary | ICD-10-CM | POA: Diagnosis not present

## 2016-01-27 DIAGNOSIS — M25571 Pain in right ankle and joints of right foot: Secondary | ICD-10-CM | POA: Diagnosis not present

## 2016-01-27 DIAGNOSIS — M62561 Muscle wasting and atrophy, not elsewhere classified, right lower leg: Secondary | ICD-10-CM | POA: Diagnosis not present

## 2016-01-27 DIAGNOSIS — M7661 Achilles tendinitis, right leg: Secondary | ICD-10-CM | POA: Diagnosis not present

## 2016-01-31 DIAGNOSIS — M62561 Muscle wasting and atrophy, not elsewhere classified, right lower leg: Secondary | ICD-10-CM | POA: Diagnosis not present

## 2016-01-31 DIAGNOSIS — M7661 Achilles tendinitis, right leg: Secondary | ICD-10-CM | POA: Diagnosis not present

## 2016-01-31 DIAGNOSIS — M25571 Pain in right ankle and joints of right foot: Secondary | ICD-10-CM | POA: Diagnosis not present

## 2016-01-31 DIAGNOSIS — M25471 Effusion, right ankle: Secondary | ICD-10-CM | POA: Diagnosis not present

## 2016-02-02 DIAGNOSIS — M62561 Muscle wasting and atrophy, not elsewhere classified, right lower leg: Secondary | ICD-10-CM | POA: Diagnosis not present

## 2016-02-02 DIAGNOSIS — M25571 Pain in right ankle and joints of right foot: Secondary | ICD-10-CM | POA: Diagnosis not present

## 2016-02-02 DIAGNOSIS — M25471 Effusion, right ankle: Secondary | ICD-10-CM | POA: Diagnosis not present

## 2016-02-02 DIAGNOSIS — M7661 Achilles tendinitis, right leg: Secondary | ICD-10-CM | POA: Diagnosis not present

## 2016-02-04 DIAGNOSIS — M7661 Achilles tendinitis, right leg: Secondary | ICD-10-CM | POA: Diagnosis not present

## 2016-02-04 DIAGNOSIS — M25471 Effusion, right ankle: Secondary | ICD-10-CM | POA: Diagnosis not present

## 2016-02-04 DIAGNOSIS — M25571 Pain in right ankle and joints of right foot: Secondary | ICD-10-CM | POA: Diagnosis not present

## 2016-02-04 DIAGNOSIS — M62561 Muscle wasting and atrophy, not elsewhere classified, right lower leg: Secondary | ICD-10-CM | POA: Diagnosis not present

## 2016-02-07 DIAGNOSIS — M7661 Achilles tendinitis, right leg: Secondary | ICD-10-CM | POA: Diagnosis not present

## 2016-02-07 DIAGNOSIS — M62561 Muscle wasting and atrophy, not elsewhere classified, right lower leg: Secondary | ICD-10-CM | POA: Diagnosis not present

## 2016-02-07 DIAGNOSIS — M25471 Effusion, right ankle: Secondary | ICD-10-CM | POA: Diagnosis not present

## 2016-02-07 DIAGNOSIS — M25571 Pain in right ankle and joints of right foot: Secondary | ICD-10-CM | POA: Diagnosis not present

## 2016-02-11 DIAGNOSIS — M79671 Pain in right foot: Secondary | ICD-10-CM | POA: Diagnosis not present

## 2016-02-11 DIAGNOSIS — M7661 Achilles tendinitis, right leg: Secondary | ICD-10-CM | POA: Diagnosis not present

## 2016-02-15 DIAGNOSIS — M7661 Achilles tendinitis, right leg: Secondary | ICD-10-CM | POA: Diagnosis not present

## 2016-02-15 DIAGNOSIS — M25571 Pain in right ankle and joints of right foot: Secondary | ICD-10-CM | POA: Diagnosis not present

## 2016-02-15 DIAGNOSIS — M25471 Effusion, right ankle: Secondary | ICD-10-CM | POA: Diagnosis not present

## 2016-02-15 DIAGNOSIS — M62561 Muscle wasting and atrophy, not elsewhere classified, right lower leg: Secondary | ICD-10-CM | POA: Diagnosis not present

## 2016-02-17 DIAGNOSIS — M62561 Muscle wasting and atrophy, not elsewhere classified, right lower leg: Secondary | ICD-10-CM | POA: Diagnosis not present

## 2016-02-17 DIAGNOSIS — M25571 Pain in right ankle and joints of right foot: Secondary | ICD-10-CM | POA: Diagnosis not present

## 2016-02-17 DIAGNOSIS — M25471 Effusion, right ankle: Secondary | ICD-10-CM | POA: Diagnosis not present

## 2016-02-17 DIAGNOSIS — M7661 Achilles tendinitis, right leg: Secondary | ICD-10-CM | POA: Diagnosis not present

## 2016-02-21 DIAGNOSIS — M62561 Muscle wasting and atrophy, not elsewhere classified, right lower leg: Secondary | ICD-10-CM | POA: Diagnosis not present

## 2016-02-21 DIAGNOSIS — M7661 Achilles tendinitis, right leg: Secondary | ICD-10-CM | POA: Diagnosis not present

## 2016-02-21 DIAGNOSIS — M25571 Pain in right ankle and joints of right foot: Secondary | ICD-10-CM | POA: Diagnosis not present

## 2016-02-21 DIAGNOSIS — M25471 Effusion, right ankle: Secondary | ICD-10-CM | POA: Diagnosis not present

## 2016-02-23 DIAGNOSIS — M25471 Effusion, right ankle: Secondary | ICD-10-CM | POA: Diagnosis not present

## 2016-02-23 DIAGNOSIS — M62561 Muscle wasting and atrophy, not elsewhere classified, right lower leg: Secondary | ICD-10-CM | POA: Diagnosis not present

## 2016-02-23 DIAGNOSIS — M25571 Pain in right ankle and joints of right foot: Secondary | ICD-10-CM | POA: Diagnosis not present

## 2016-02-23 DIAGNOSIS — M7661 Achilles tendinitis, right leg: Secondary | ICD-10-CM | POA: Diagnosis not present

## 2016-03-01 DIAGNOSIS — M62561 Muscle wasting and atrophy, not elsewhere classified, right lower leg: Secondary | ICD-10-CM | POA: Diagnosis not present

## 2016-03-01 DIAGNOSIS — M7661 Achilles tendinitis, right leg: Secondary | ICD-10-CM | POA: Diagnosis not present

## 2016-03-01 DIAGNOSIS — M25571 Pain in right ankle and joints of right foot: Secondary | ICD-10-CM | POA: Diagnosis not present

## 2016-03-01 DIAGNOSIS — M25471 Effusion, right ankle: Secondary | ICD-10-CM | POA: Diagnosis not present

## 2016-03-03 DIAGNOSIS — M25571 Pain in right ankle and joints of right foot: Secondary | ICD-10-CM | POA: Diagnosis not present

## 2016-03-03 DIAGNOSIS — M25471 Effusion, right ankle: Secondary | ICD-10-CM | POA: Diagnosis not present

## 2016-03-03 DIAGNOSIS — M62561 Muscle wasting and atrophy, not elsewhere classified, right lower leg: Secondary | ICD-10-CM | POA: Diagnosis not present

## 2016-03-03 DIAGNOSIS — M7661 Achilles tendinitis, right leg: Secondary | ICD-10-CM | POA: Diagnosis not present

## 2016-03-06 DIAGNOSIS — M25571 Pain in right ankle and joints of right foot: Secondary | ICD-10-CM | POA: Diagnosis not present

## 2016-03-06 DIAGNOSIS — M7661 Achilles tendinitis, right leg: Secondary | ICD-10-CM | POA: Diagnosis not present

## 2016-03-06 DIAGNOSIS — M62561 Muscle wasting and atrophy, not elsewhere classified, right lower leg: Secondary | ICD-10-CM | POA: Diagnosis not present

## 2016-03-06 DIAGNOSIS — M25471 Effusion, right ankle: Secondary | ICD-10-CM | POA: Diagnosis not present

## 2016-03-08 DIAGNOSIS — M62561 Muscle wasting and atrophy, not elsewhere classified, right lower leg: Secondary | ICD-10-CM | POA: Diagnosis not present

## 2016-03-08 DIAGNOSIS — G4730 Sleep apnea, unspecified: Secondary | ICD-10-CM | POA: Diagnosis not present

## 2016-03-08 DIAGNOSIS — N951 Menopausal and female climacteric states: Secondary | ICD-10-CM | POA: Diagnosis not present

## 2016-03-08 DIAGNOSIS — M25571 Pain in right ankle and joints of right foot: Secondary | ICD-10-CM | POA: Diagnosis not present

## 2016-03-08 DIAGNOSIS — M25471 Effusion, right ankle: Secondary | ICD-10-CM | POA: Diagnosis not present

## 2016-03-08 DIAGNOSIS — M7661 Achilles tendinitis, right leg: Secondary | ICD-10-CM | POA: Diagnosis not present

## 2016-03-08 DIAGNOSIS — K210 Gastro-esophageal reflux disease with esophagitis: Secondary | ICD-10-CM | POA: Diagnosis not present

## 2016-03-08 DIAGNOSIS — I10 Essential (primary) hypertension: Secondary | ICD-10-CM | POA: Diagnosis not present

## 2016-03-13 DIAGNOSIS — Z23 Encounter for immunization: Secondary | ICD-10-CM | POA: Diagnosis not present

## 2016-03-13 DIAGNOSIS — E789 Disorder of lipoprotein metabolism, unspecified: Secondary | ICD-10-CM | POA: Diagnosis not present

## 2016-03-13 DIAGNOSIS — I872 Venous insufficiency (chronic) (peripheral): Secondary | ICD-10-CM | POA: Diagnosis not present

## 2016-03-13 DIAGNOSIS — I10 Essential (primary) hypertension: Secondary | ICD-10-CM | POA: Diagnosis not present

## 2016-03-13 DIAGNOSIS — E668 Other obesity: Secondary | ICD-10-CM | POA: Diagnosis not present

## 2016-03-17 DIAGNOSIS — M7661 Achilles tendinitis, right leg: Secondary | ICD-10-CM | POA: Diagnosis not present

## 2016-12-13 ENCOUNTER — Other Ambulatory Visit (HOSPITAL_COMMUNITY): Payer: Self-pay | Admitting: Pulmonary Disease

## 2016-12-13 DIAGNOSIS — I2510 Atherosclerotic heart disease of native coronary artery without angina pectoris: Secondary | ICD-10-CM

## 2016-12-14 ENCOUNTER — Ambulatory Visit (HOSPITAL_COMMUNITY)
Admission: RE | Admit: 2016-12-14 | Discharge: 2016-12-14 | Disposition: A | Discharge status: Home / Self Care | Payer: 59 | Source: Ambulatory Visit | Attending: Pulmonary Disease | Admitting: Pulmonary Disease

## 2016-12-14 DIAGNOSIS — I2510 Atherosclerotic heart disease of native coronary artery without angina pectoris: Secondary | ICD-10-CM

## 2016-12-14 DIAGNOSIS — I700 Atherosclerosis of aorta: Secondary | ICD-10-CM | POA: Diagnosis not present

## 2016-12-14 DIAGNOSIS — I25119 Atherosclerotic heart disease of native coronary artery with unspecified angina pectoris: Secondary | ICD-10-CM | POA: Diagnosis present

## 2016-12-14 NOTE — Progress Notes (Signed)
*  PRELIMINARY RESULTS* Echocardiogram 2D Echocardiogram has been performed.  Leavy Cella 12/14/2016, 11:15 AM

## 2017-01-19 ENCOUNTER — Ambulatory Visit (INDEPENDENT_AMBULATORY_CARE_PROVIDER_SITE_OTHER): Payer: 59 | Admitting: Cardiology

## 2017-01-19 ENCOUNTER — Other Ambulatory Visit (HOSPITAL_COMMUNITY)
Admission: RE | Admit: 2017-01-19 | Discharge: 2017-01-19 | Disposition: A | Discharge status: Home / Self Care | Payer: 59 | Source: Ambulatory Visit | Attending: Cardiology | Admitting: Cardiology

## 2017-01-19 ENCOUNTER — Encounter: Payer: Self-pay | Admitting: Cardiology

## 2017-01-19 VITALS — BP 178/108 | HR 91 | Ht 63.0 in | Wt 264.0 lb

## 2017-01-19 DIAGNOSIS — I2510 Atherosclerotic heart disease of native coronary artery without angina pectoris: Secondary | ICD-10-CM | POA: Diagnosis not present

## 2017-01-19 DIAGNOSIS — Z79899 Other long term (current) drug therapy: Secondary | ICD-10-CM | POA: Insufficient documentation

## 2017-01-19 DIAGNOSIS — I5032 Chronic diastolic (congestive) heart failure: Secondary | ICD-10-CM | POA: Diagnosis not present

## 2017-01-19 DIAGNOSIS — I10 Essential (primary) hypertension: Secondary | ICD-10-CM

## 2017-01-19 LAB — BASIC METABOLIC PANEL
Anion gap: 9 (ref 5–15)
BUN: 16 mg/dL (ref 6–20)
CO2: 27 mmol/L (ref 22–32)
Calcium: 9.7 mg/dL (ref 8.9–10.3)
Chloride: 105 mmol/L (ref 101–111)
Creatinine, Ser: 0.8 mg/dL (ref 0.44–1.00)
GFR calc Af Amer: >60 mL/min (ref >60)
GLUCOSE: 104 mg/dL — AB (ref 65–99)
POTASSIUM: 3.5 mmol/L (ref 3.5–5.1)
Sodium: 141 mmol/L (ref 135–145)

## 2017-01-19 LAB — MAGNESIUM: Magnesium: 1.7 mg/dL (ref 1.7–2.4)

## 2017-01-19 NOTE — Patient Instructions (Signed)
Medication Instructions:  Your physician recommends that you continue on your current medications as directed. Please refer to the Current Medication list given to you today.   Labwork: Your physician recommends that you return for lab work in: Today    Testing/Procedures: NONE   Follow-Up: Your physician recommends that you schedule a follow-up appointment in: 3 Months    Any Other Special Instructions Will Be Listed Below (If Applicable).     If you need a refill on your cardiac medications before your next appointment, please call your pharmacy.  Thank you for choosing [facility]!   

## 2017-01-19 NOTE — Progress Notes (Signed)
Clinical Summary Ms. Villasenor is a 61 y.o.female seen as a new patient, she is referred by Dr Luan Pulling for fatigue.   1. Chronic diastolic HF - noted on recent echo  Can have some SOB at times. DOE at about 1 block which is fairly new. Highest level of activity is housework, typically has to stop due to swelling in her legs - no recent chest pain.- NO orthopnea, no PND - edema improving with lasix, takes few times a week.     2. CAD - history of prior CABG in 2000 - no recent chest pain.- NO orthopnea, no PND - edema improving with lasix, takes few times a week.   3. HTN - compliant with meds - does not check at home.   Past Medical History:  Diagnosis Date  . Arthritis   . CAD (coronary artery disease)   . GERD (gastroesophageal reflux disease)   . Hx of adenomatous colonic polyps   . Hypercholesterolemia   . Hypertension   . Hypothyroidism   . Sleep apnea    pt doesn't wear her CPAP machine-PCP knows patient unable to wear CPAP     No Known Allergies   Current Outpatient Prescriptions  Medication Sig Dispense Refill  . aspirin 81 MG tablet Take 81 mg by mouth daily.      Marland Kitchen atorvastatin (LIPITOR) 20 MG tablet Take 20 mg by mouth daily.    Marland Kitchen EST ESTROGENS-METHYLTEST HS 0.625-1.25 MG per tablet Take 1 tablet by mouth daily.     Marland Kitchen levothyroxine (SYNTHROID, LEVOTHROID) 175 MCG tablet Take 175 mcg by mouth daily.     Marland Kitchen losartan (COZAAR) 100 MG tablet Take 100 mg by mouth daily.     . naproxen (NAPROSYN) 500 MG tablet Take 500 mg by mouth daily.    . pantoprazole (PROTONIX) 40 MG tablet Take 40 mg by mouth daily.     . verapamil (VERELAN PM) 360 MG 24 hr capsule Take 360 mg by mouth at bedtime.     No current facility-administered medications for this visit.      Past Surgical History:  Procedure Laterality Date  . ABDOMINAL HYSTERECTOMY    . BONE EXOSTOSIS EXCISION  04/25/2012   Procedure: EXOSTOSIS EXCISION;  Surgeon: Marcheta Grammes, DPM;  Location:  [facility];  Service: Orthopedics;  Laterality: Left;  Retrocalcaneal Exostectomy Left Foot  . CATARACT EXTRACTION W/PHACO Right 09/08/2013   Procedure: CATARACT EXTRACTION PHACO AND INTRAOCULAR LENS PLACEMENT (IOC);  Surgeon: Tonny Kassey Laforest, MD;  Location: [facility];  Service: Ophthalmology;  Laterality: Right;  CDE 9.13  . CHOLECYSTECTOMY    . COLONOSCOPY  04/16/07   diminutive rectal polyp removed, scattered diverticula, path: tubular adenoma  . COLONOSCOPY  04/14/02   external hemorrhoids otherwise normal  . COLONOSCOPY  05/24/2011   Dr. Rourk:tubular adenoma and hyperplastic polyp, long redundant colon/left sided diverticulosis/internal hemorrhoids, incomplete examination, unable to reach the cecum despite external abdominal pressure, changing position. Barium enema incomplete.   . COLONOSCOPY N/A 02/05/2015   Procedure: COLONOSCOPY;  Surgeon: Daneil Dolin, MD;  Location: [facility];  Service: Endoscopy;  Laterality: N/A;  115 - moved to 12:15 - office to notify   . CORONARY ARTERY BYPASS GRAFT  2000   triple bypass  . S/P Hysterectomy       No Known Allergies    Family History  Problem Relation Age of Onset  . Colon cancer Mother        diagnosed at age 17,  passed away at age 74 from metastasis  . Heart attack Father   . Prostate cancer Brother   . COPD Brother      Social History Ms. Countess reports that she has been smoking Cigarettes.  She has a 7.50 pack-year smoking history. She does not have any smokeless tobacco history on file. Ms. Sosinski reports that she does not drink alcohol.   Review of Systems CONSTITUTIONAL: +fatigue HEENT: Eyes: No visual loss, blurred vision, double vision or yellow sclerae.No hearing loss, sneezing, congestion, runny nose or sore throat.  SKIN: No rash or itching.  CARDIOVASCULAR: per hpi RESPIRATORY: per hpi  GASTROINTESTINAL: No anorexia, nausea, vomiting or diarrhea. No abdominal pain or blood.  GENITOURINARY: No burning on urination, no  polyuria NEUROLOGICAL: No headache, dizziness, syncope, paralysis, ataxia, numbness or tingling in the extremities. No change in bowel or bladder control.  MUSCULOSKELETAL: No muscle, back pain, joint pain or stiffness.  LYMPHATICS: No enlarged nodes. No history of splenectomy.  PSYCHIATRIC: No history of depression or anxiety.  ENDOCRINOLOGIC: No reports of sweating, cold or heat intolerance. No polyuria or polydipsia.  Marland Kitchen   Physical Examination Vitals:   01/19/17 1259 01/19/17 1303  BP: (!) 162/98 (!) 178/108  Pulse: 91   SpO2: 93%    Vitals:   01/19/17 1259  Weight: 264 lb (119.7 kg)  Height: 5\' 3"  (1.6 m)    Gen: resting comfortably, no acute distress HEENT: no scleral icterus, pupils equal round and reactive, no palptable cervical adenopathy,  CV" RRR, no mrg/, no jvd Resp: Clear to auscultation bilaterally GI: abdomen is soft, non-tender, non-distended, normal bowel sounds, no hepatosplenomegaly MSK: extremities are warm, no edema.  Skin: warm, no rash Neuro:  no focal deficits Psych: appropriate affect   Diagnostic Studies 12/2016 echo Study Conclusions  - Left ventricle: The cavity size was normal. Wall thickness was   increased in a pattern of moderate LVH. Systolic function was   normal. The estimated ejection fraction was in the range of 55%   to 60%. Wall motion was normal; there were no regional wall   motion abnormalities. Doppler parameters are consistent with   abnormal left ventricular relaxation (grade 1 diastolic   dysfunction). Doppler parameters are consistent with high   ventricular filling pressure. - Aortic valve: Mildly calcified annulus. Mildly thickened   leaflets. Valve area (VTI): 1.96 cm^2. Valve area (Vmax): 1.76   cm^2. - Mitral valve: Mildly calcified annulus. Mildly thickened leaflets   . - Technically difficult study.    Assessment and Plan  1. Chronic diastolic HF -edema improving with lasix, we will repeat BMET/Mg in  diuretic  2. HTN - elevated in clinic. Would like  start chlorthalidone 12.5mg  daily and change lasix to prn only, will need to check a BMET prior to starting however - check BMET 2 weeks after starting chlorthalidone - nursing visit on 2 weeks for bp check  3. CAD - no symptoms -continue current meds   F/u 3 months   Arnoldo Lenis, M.D.

## 2017-01-24 ENCOUNTER — Telehealth: Payer: Self-pay

## 2017-01-24 DIAGNOSIS — Z79899 Other long term (current) drug therapy: Secondary | ICD-10-CM

## 2017-01-24 MED ORDER — CHLORTHALIDONE 25 MG PO TABS: 12.5000 mg | ORAL_TABLET | Freq: Every day | ORAL | 3 refills | Status: DC

## 2017-01-24 NOTE — Telephone Encounter (Signed)
-----   Message from [place] sent at 01/24/2017 12:48 PM EDT -----   ----- Message ----- From: Arnoldo Lenis, MD Sent: 01/24/2017  12:45 PM To: Staci T Ashworth, CMA  Labs loko good. Please have her start chlorthalide 12.5mg  daily, change her lasix to same dose but prn only leg swelling. Needs repeat BMET/Mg in 2 weeks  Zandra Abts MD

## 2017-01-24 NOTE — Telephone Encounter (Signed)
Called pt., no answer. Left message for pt to return call.  

## 2017-01-29 ENCOUNTER — Telehealth: Payer: Self-pay

## 2017-01-29 NOTE — Telephone Encounter (Signed)
-----   Message from [place] sent at 01/24/2017 12:48 PM EDT -----   ----- Message ----- From: Arnoldo Lenis, MD Sent: 01/24/2017  12:45 PM To: Staci T Ashworth, CMA  Labs loko good. Please have her start chlorthalide 12.5mg  daily, change her lasix to same dose but prn only leg swelling. Needs repeat BMET/Mg in 2 weeks  Zandra Abts MD

## 2017-01-29 NOTE — Telephone Encounter (Signed)
Called pt. No answer. Left message for pt to return call.  

## 2017-03-06 ENCOUNTER — Encounter: Payer: Self-pay | Admitting: Cardiology

## 2017-04-19 ENCOUNTER — Other Ambulatory Visit (HOSPITAL_COMMUNITY)
Admission: RE | Admit: 2017-04-19 | Discharge: 2017-04-19 | Disposition: A | Discharge status: Home / Self Care | Payer: 59 | Source: Ambulatory Visit | Attending: Cardiology | Admitting: Cardiology

## 2017-04-19 DIAGNOSIS — Z79899 Other long term (current) drug therapy: Secondary | ICD-10-CM | POA: Insufficient documentation

## 2017-04-19 LAB — BASIC METABOLIC PANEL
Anion gap: 9 (ref 5–15)
BUN: 19 mg/dL (ref 6–20)
CALCIUM: 9.7 mg/dL (ref 8.9–10.3)
CO2: 29 mmol/L (ref 22–32)
CREATININE: 0.88 mg/dL (ref 0.44–1.00)
Chloride: 102 mmol/L (ref 101–111)
GFR calc non Af Amer: >60 mL/min (ref >60)
Glucose, Bld: 110 mg/dL — ABNORMAL HIGH (ref 65–99)
Potassium: 3.1 mmol/L — ABNORMAL LOW (ref 3.5–5.1)
SODIUM: 140 mmol/L (ref 135–145)

## 2017-04-19 LAB — MAGNESIUM: MAGNESIUM: 1.6 mg/dL — AB (ref 1.7–2.4)

## 2017-04-24 ENCOUNTER — Ambulatory Visit: Payer: 59 | Admitting: Cardiology

## 2017-04-24 ENCOUNTER — Ambulatory Visit: Payer: 59 | Admitting: Adult Health

## 2017-04-30 ENCOUNTER — Telehealth: Payer: Self-pay

## 2017-04-30 DIAGNOSIS — Z79899 Other long term (current) drug therapy: Secondary | ICD-10-CM

## 2017-04-30 MED ORDER — MAGNESIUM OXIDE 400 MG PO CAPS: 400.0000 mg | ORAL_CAPSULE | Freq: Two times a day (BID) | ORAL | 3 refills | Status: DC

## 2017-04-30 MED ORDER — POTASSIUM CHLORIDE CRYS ER 20 MEQ PO TBCR: 20.0000 meq | EXTENDED_RELEASE_TABLET | Freq: Every day | ORAL | 3 refills | Status: DC

## 2017-04-30 NOTE — Telephone Encounter (Signed)
-----   Message from Arnoldo Lenis, MD sent at 04/30/2017 11:11 AM EST ----- Low potassium and magnesium, likely due to her fluid pill. Please start KCl 21mEq bid for 3 days, then take 14mEq daily. Take magnesium oxide 400mg  bid x 5 days. Repeat BMET and Mg in 2 weeks. Did she start the chlorthalidone? Is she taking the lasix just prn, and if so how often?  Carlyle Dolly MD

## 2017-04-30 NOTE — Telephone Encounter (Signed)
Pt states that she was taking chlorthalidone 25 mg daily and not 12.5 mg daily as prescribed. She takes her lasix only PRN. I SENT IN POTASSIM 20 MEQ, MAG. OXIDE 400 MG. I put lab orders in.

## 2017-04-30 NOTE — Progress Notes (Signed)
Cardiology Office Note    Date:  05/01/2017   ID:  [name], DOB 1956-05-13, MRN [id number]  PCP:  Mariah Du, MD  Cardiologist: Dr. Harl Bowie   Chief Complaint  Patient presents with  . Follow-up    3 month visit    History of Present Illness:    Mariah Stewart is a 61 y.o. female with past medical history of CAD (s/p CABG in 2000), chronic diastolic CHF, HTN, and HLD who presents to the office today for 28-month follow-up.   She was last examined by Dr. Harl Bowie in 01/2017 and reported having dyspnea on exertion which typically occurs with walking 1 city block. She denied any associated chest discomfort with this. BP was elevated at 178/108, therefore she was started on Chlorthalidone 12.5 mg daily Lasix was switched to as needed. Labs were rechecked on 04/19/2017 and showed a low K+ of 3.1 and stable creatinine of 0.88. Mg was at 1.6. She was started on K-dur 20 mEq BID for 3 days then 20 mEq daily and Mag Oxide 400mg  BID for 5 days.   In talking with the patient today, she reports significant improvement in her symptoms since her last office visit. She reports her lower extremity edema almost resolved completely within a few days of starting on Chlorthalidone. She was initially confused about how to take the medication and was taking 25 mg daily instead of 12.5 mg daily as prescribed.  Labs were obtained when she was on the elevated dosing, therefore she has been hesitant to start potassium or magnesium supplementation since she has reduced her dosing of the medication.  She has also experienced improvement in her dyspnea. Denies any recent orthopnea, PND, chest discomfort, or palpitations. She does not check her blood pressure regularly at home but it is improved to 140/80 during today's visit.   Past Medical History:  Diagnosis Date  . Arthritis   . CAD (coronary artery disease)    a. s/p CABG in 2000  . GERD (gastroesophageal reflux disease)   . Hx of adenomatous colonic  polyps   . Hypercholesterolemia   . Hypertension   . Hypothyroidism   . Sleep apnea    pt doesn't wear her CPAP machine-PCP knows patient unable to wear CPAP    Past Surgical History:  Procedure Laterality Date  . ABDOMINAL HYSTERECTOMY    . BONE EXOSTOSIS EXCISION  04/25/2012   Procedure: EXOSTOSIS EXCISION;  Surgeon: Marcheta Grammes, DPM;  Location: [facility];  Service: Orthopedics;  Laterality: Left;  Retrocalcaneal Exostectomy Left Foot  . CATARACT EXTRACTION W/PHACO Right 09/08/2013   Procedure: CATARACT EXTRACTION PHACO AND INTRAOCULAR LENS PLACEMENT (IOC);  Surgeon: Tonny Branch, MD;  Location: [facility];  Service: Ophthalmology;  Laterality: Right;  CDE 9.13  . CHOLECYSTECTOMY    . COLONOSCOPY  04/16/07   diminutive rectal polyp removed, scattered diverticula, path: tubular adenoma  . COLONOSCOPY  04/14/02   external hemorrhoids otherwise normal  . COLONOSCOPY  05/24/2011   Dr. Rourk:tubular adenoma and hyperplastic polyp, long redundant colon/left sided diverticulosis/internal hemorrhoids, incomplete examination, unable to reach the cecum despite external abdominal pressure, changing position. Barium enema incomplete.   . COLONOSCOPY N/A 02/05/2015   Procedure: COLONOSCOPY;  Surgeon: Daneil Dolin, MD;  Location: [facility];  Service: Endoscopy;  Laterality: N/A;  115 - moved to 12:15 - office to notify   . CORONARY ARTERY BYPASS GRAFT  2000   triple bypass  . S/P Hysterectomy  Current Medications: Outpatient Medications Prior to Visit  Medication Sig Dispense Refill  . aspirin 81 MG tablet Take 81 mg by mouth daily.      Marland Kitchen atorvastatin (LIPITOR) 20 MG tablet Take 20 mg by mouth daily.    . chlorthalidone (HYGROTON) 25 MG tablet Take 0.5 tablets (12.5 mg total) by mouth daily. 45 tablet 3  . furosemide (LASIX) 40 MG tablet Take 40 mg by mouth daily as needed.  5  . levothyroxine (SYNTHROID, LEVOTHROID) 175 MCG tablet Take 175 mcg by mouth daily.     Marland Kitchen losartan (COZAAR)  100 MG tablet Take 100 mg by mouth daily.     . Magnesium Oxide 400 MG CAPS Take 1 capsule (400 mg total) by mouth 2 (two) times daily. 30 capsule 3  . naproxen (NAPROSYN) 500 MG tablet Take 500 mg by mouth daily.    . pantoprazole (PROTONIX) 40 MG tablet Take 40 mg by mouth daily.     . potassium chloride SA (K-DUR,KLOR-CON) 20 MEQ tablet Take 1 tablet (20 mEq total) by mouth daily. 93 tablet 3  . verapamil (VERELAN PM) 360 MG 24 hr capsule Take 360 mg by mouth at bedtime.     No facility-administered medications prior to visit.      Allergies:   Patient has no known allergies.   Social History   Socioeconomic History  . Marital status: Single    Spouse name: None  . Number of children: 0  . Years of education: None  . Highest education level: None  Social Needs  . Financial resource strain: None  . Food insecurity - worry: None  . Food insecurity - inability: None  . Transportation needs - medical: None  . Transportation needs - non-medical: None  Occupational History  . Occupation: full-time    Employer: [employer]  Tobacco Use  . Smoking status: Current Every Day Smoker    Packs/day: 0.50    Years: 15.00    Pack years: 7.50    Types: Cigarettes  . Smokeless tobacco: Never Used  Substance and Sexual Activity  . Alcohol use: No  . Drug use: No  . Sexual activity: Yes    Birth control/protection: Surgical  Other Topics Concern  . None  Social History Narrative  . None     Family History:  The patient's family history includes COPD in her brother; Colon cancer in her mother; Heart attack in her father; Prostate cancer in her brother.   Review of Systems:   Please see the history of present illness.     General:  No chills, fever, night sweats or weight changes.  Cardiovascular:  No chest pain, dyspnea on exertion, orthopnea, palpitations, paroxysmal nocturnal dyspnea. Positive for lower extremity edema (improved).  Dermatological: No rash,  lesions/masses Respiratory: No cough, dyspnea Urologic: No hematuria, dysuria Abdominal:   No nausea, vomiting, diarrhea, bright red blood per rectum, melena, or hematemesis Neurologic:  No visual changes, wkns, changes in mental status. All other systems reviewed and are otherwise negative except as noted above.   Physical Exam:    VS:  BP 140/80 (BP Location: Left Arm)   Pulse 89   Ht 5\' 3"  (1.6 m)   Wt 262 lb (118.8 kg)   SpO2 97%   BMI 46.41 kg/m    General: Well developed, obese African American female appearing in no acute distress. Head: Normocephalic, atraumatic, sclera non-icteric, no xanthomas, nares are without discharge.  Neck: No carotid bruits. JVD not elevated.  Lungs: Respirations regular and unlabored, without wheezes or rales.  Heart: Regular rate and rhythm. No S3 or S4.  No murmur, no rubs, or gallops appreciated. Abdomen: Soft, non-tender, non-distended with normoactive bowel sounds. No hepatomegaly. No rebound/guarding. No obvious abdominal masses. Msk:  Strength and tone appear normal for age. No joint deformities or effusions. Extremities: No clubbing or cyanosis. No lower extremity edema.  Distal pedal pulses are 2+ bilaterally. Neuro: Alert and oriented X 3. Moves all extremities spontaneously. No focal deficits noted. Psych:  Responds to questions appropriately with a normal affect. Skin: No rashes or lesions noted  Wt Readings from Last 3 Encounters:  05/01/17 262 lb (118.8 kg)  01/19/17 264 lb (119.7 kg)  02/05/15 262 lb (118.8 kg)    Studies/Labs Reviewed:   EKG:  EKG is not ordered today.   Recent Labs: 05/01/2017: BUN 16; Creatinine, Ser 1.09; Magnesium 1.8; Potassium 3.4; Sodium 142   Lipid Panel No results found for: CHOL, TRIG, HDL, CHOLHDL, VLDL, LDLCALC, LDLDIRECT  Additional studies/ records that were reviewed today include:   Echocardiogram: 12/2016 Study Conclusions  - Left ventricle: The cavity size was normal. Wall thickness  was   increased in a pattern of moderate LVH. Systolic function was   normal. The estimated ejection fraction was in the range of 55%   to 60%. Wall motion was normal; there were no regional wall   motion abnormalities. Doppler parameters are consistent with   abnormal left ventricular relaxation (grade 1 diastolic   dysfunction). Doppler parameters are consistent with high   ventricular filling pressure. - Aortic valve: Mildly calcified annulus. Mildly thickened   leaflets. Valve area (VTI): 1.96 cm^2. Valve area (Vmax): 1.76   cm^2. - Mitral valve: Mildly calcified annulus. Mildly thickened leaflets   . - Technically difficult study.  Assessment:    1. Chronic diastolic heart failure ([place])   2. Coronary artery disease involving native coronary artery of native heart without angina pectoris   3. Essential hypertension   4. Hypokalemia   5. Hypomagnesemia      Plan:   In order of problems listed above:  1. Chronic Diastolic CHF - Patient reports significant improvement in her lower extremity edema and dyspnea since being started on Chlorthalidone. Initially taking twice her prescribed dose but switched back to 12.5mg  daily two weeks ago. She has not had to utilize her as needed Lasix. - Will continue on Chlorthalidone 12.5 mg daily. Recheck BMET today to reassess kidney function and K+ levels.   2. CAD  - s/p CABG in 2000. Recent echocardiogram showed a preserved EF of 55-60% with no regional WMA.  - She denies any recent anginal symptoms.  - continue ASA and statin therapy.   3. HTN - BP was significantly elevated at 178/108 during her recent office visit. Improved to 140/80 during today's visit. - I have asked the patient to check her blood pressure in the ambulatory setting. If this remains elevated, consider increasing Chlorthalidone to 25 mg daily. Continue Losartan 100 mg daily and Verapamil 360 mg daily.   4. Hypokalemia/ Hypomagnesemia - K+ at 3.1 and Mg at 1.6  during most recent check. Was prescribed K-dur and Mag Oxide but has not yet started these due to thinking her electrolyte abnormalities were due to taking too much Chlorthalidone. Will recheck labs today. If still not WNL, the patient is in agreement to start supplementation at that time.    Medication Adjustments/Labs and Tests Ordered: Current medicines are reviewed at length  with the patient today.  Concerns regarding medicines are outlined above.  Medication changes, Labs and Tests ordered today are listed in the Patient Instructions below. Patient Instructions  Medication Instructions:  Your physician recommends that you continue on your current medications as directed. Please refer to the Current Medication list given to you today.  Labwork: TODAY   Testing/Procedures: NONE  Follow-Up: Your physician wants you to follow-up in: 6 MONTHS .  You will receive a reminder letter in the mail two months in advance. If you don't receive a letter, please call our office to schedule the follow-up appointment.  Any Other Special Instructions Will Be Listed Below (If Applicable).   If you need a refill on your cardiac medications before your next appointment, please call your pharmacy.    Signed, Erma Heritage, PA-C  05/01/2017 4:39 PM    [facility] [address], [place], [place]  [address] Phone: [phone]; Fax: [phone]  [address] [place], [place] [address] Phone: [phone]

## 2017-04-30 NOTE — Telephone Encounter (Signed)
-----   Message from Arnoldo Lenis, MD sent at 04/30/2017 11:11 AM EST ----- Low potassium and magnesium, likely due to her fluid pill. Please start KCl 33mEq bid for 3 days, then take 62mEq daily. Take magnesium oxide 400mg  bid x 5 days. Repeat BMET and Mg in 2 weeks. Did she start the chlorthalidone? Is she taking the lasix just prn, and if so how often?  Carlyle Dolly MD

## 2017-05-01 ENCOUNTER — Ambulatory Visit: Payer: 59 | Admitting: Student

## 2017-05-01 ENCOUNTER — Encounter: Payer: Self-pay | Admitting: Student

## 2017-05-01 ENCOUNTER — Other Ambulatory Visit (HOSPITAL_COMMUNITY)
Admission: RE | Admit: 2017-05-01 | Discharge: 2017-05-01 | Disposition: A | Discharge status: Home / Self Care | Payer: 59 | Source: Ambulatory Visit | Attending: Cardiology | Admitting: Cardiology

## 2017-05-01 VITALS — BP 140/80 | HR 89 | Ht 63.0 in | Wt 262.0 lb

## 2017-05-01 DIAGNOSIS — E876 Hypokalemia: Secondary | ICD-10-CM

## 2017-05-01 DIAGNOSIS — I2510 Atherosclerotic heart disease of native coronary artery without angina pectoris: Secondary | ICD-10-CM | POA: Diagnosis not present

## 2017-05-01 DIAGNOSIS — I10 Essential (primary) hypertension: Secondary | ICD-10-CM | POA: Diagnosis not present

## 2017-05-01 DIAGNOSIS — Z79899 Other long term (current) drug therapy: Secondary | ICD-10-CM | POA: Diagnosis present

## 2017-05-01 DIAGNOSIS — I5032 Chronic diastolic (congestive) heart failure: Secondary | ICD-10-CM | POA: Diagnosis not present

## 2017-05-01 LAB — BASIC METABOLIC PANEL
Anion gap: 12 (ref 5–15)
BUN: 16 mg/dL (ref 6–20)
CO2: 25 mmol/L (ref 22–32)
Calcium: 9.8 mg/dL (ref 8.9–10.3)
Chloride: 105 mmol/L (ref 101–111)
Creatinine, Ser: 1.09 mg/dL — ABNORMAL HIGH (ref 0.44–1.00)
GFR calc Af Amer: >60 mL/min (ref >60)
GFR, EST NON AFRICAN AMERICAN: 53 mL/min — AB (ref >60)
Glucose, Bld: 95 mg/dL (ref 65–99)
POTASSIUM: 3.4 mmol/L — AB (ref 3.5–5.1)
SODIUM: 142 mmol/L (ref 135–145)

## 2017-05-01 LAB — MAGNESIUM: MAGNESIUM: 1.8 mg/dL (ref 1.7–2.4)

## 2017-05-01 NOTE — Patient Instructions (Signed)
Medication Instructions:  Your physician recommends that you continue on your current medications as directed. Please refer to the Current Medication list given to you today.   Labwork: TODAY   Testing/Procedures: NONE  Follow-Up: Your physician wants you to follow-up in: 6 MONTHS. You will receive a reminder letter in the mail two months in advance. If you don't receive a letter, please call our office to schedule the follow-up appointment.   Any Other Special Instructions Will Be Listed Below (If Applicable).     If you need a refill on your cardiac medications before your next appointment, please call your pharmacy.   

## 2017-05-03 ENCOUNTER — Telehealth: Payer: Self-pay

## 2017-05-03 DIAGNOSIS — Z79899 Other long term (current) drug therapy: Secondary | ICD-10-CM

## 2017-05-03 NOTE — Telephone Encounter (Signed)
Spoke with patient. Advised her of labs and medication changes. She voiced understanding of plan. She will repeat labs in 1 month.

## 2017-05-03 NOTE — Telephone Encounter (Signed)
-----   Message from Arnoldo Lenis, MD sent at 05/02/2017 12:30 PM EST ----- Labs reviewed. From her appt with PA Ahmed Prima appears she did not start her potassium or magnesium at home. Magnesium looks ok, so she does not need to start that. Her potassium is better but remains low. I would take KCl 41meQ bid then take 87mEq daily. Mild stress on kidneys from chlorthalidone, she needs to stay well hydrated. The purpose of the diuretic is to get the salt out of her system, she still needs to stay well hydrated with 2-3 liters of water daily. Repeat BMET/Mg in 1 month  Zandra Abts MD

## 2017-06-07 ENCOUNTER — Other Ambulatory Visit (HOSPITAL_COMMUNITY): Payer: Self-pay | Admitting: Pulmonary Disease

## 2017-06-07 DIAGNOSIS — Z1231 Encounter for screening mammogram for malignant neoplasm of breast: Secondary | ICD-10-CM

## 2017-06-13 ENCOUNTER — Other Ambulatory Visit (HOSPITAL_COMMUNITY)
Admission: RE | Admit: 2017-06-13 | Discharge: 2017-06-13 | Disposition: A | Discharge status: Home / Self Care | Payer: BLUE CROSS/BLUE SHIELD | Source: Ambulatory Visit | Attending: Cardiology | Admitting: Cardiology

## 2017-06-13 ENCOUNTER — Ambulatory Visit (HOSPITAL_COMMUNITY)
Admission: RE | Admit: 2017-06-13 | Discharge: 2017-06-13 | Disposition: A | Discharge status: Home / Self Care | Payer: BLUE CROSS/BLUE SHIELD | Source: Ambulatory Visit | Attending: Pulmonary Disease | Admitting: Pulmonary Disease

## 2017-06-13 DIAGNOSIS — Z1231 Encounter for screening mammogram for malignant neoplasm of breast: Secondary | ICD-10-CM | POA: Diagnosis not present

## 2017-06-13 DIAGNOSIS — E876 Hypokalemia: Secondary | ICD-10-CM | POA: Diagnosis not present

## 2017-06-13 LAB — BASIC METABOLIC PANEL
ANION GAP: 11 (ref 5–15)
BUN: 18 mg/dL (ref 6–20)
CALCIUM: 9.7 mg/dL (ref 8.9–10.3)
CO2: 26 mmol/L (ref 22–32)
Chloride: 104 mmol/L (ref 101–111)
Creatinine, Ser: 0.96 mg/dL (ref 0.44–1.00)
GFR calc Af Amer: >60 mL/min (ref >60)
GFR calc non Af Amer: >60 mL/min (ref >60)
Glucose, Bld: 93 mg/dL (ref 65–99)
POTASSIUM: 3.4 mmol/L — AB (ref 3.5–5.1)
SODIUM: 141 mmol/L (ref 135–145)

## 2017-06-13 LAB — MAGNESIUM: MAGNESIUM: 1.6 mg/dL — AB (ref 1.7–2.4)

## 2017-06-13 LAB — HM MAMMOGRAPHY

## 2017-06-18 ENCOUNTER — Telehealth: Payer: Self-pay

## 2017-06-18 MED ORDER — MAGNESIUM OXIDE 400 MG PO CAPS: 400.0000 mg | ORAL_CAPSULE | Freq: Every day | ORAL | 3 refills | Status: DC

## 2017-06-18 MED ORDER — POTASSIUM CHLORIDE CRYS ER 20 MEQ PO TBCR: 40.0000 meq | EXTENDED_RELEASE_TABLET | Freq: Every day | ORAL | 3 refills | Status: DC

## 2017-06-18 NOTE — Telephone Encounter (Signed)
-----   Message from Arnoldo Lenis, MD sent at 06/18/2017 12:45 PM EST ----- Potassium and magnesium are a little low. Verify taking KCl 20 daily and increase to 13mEq daily. I don't believe she ever started her magnesium oxide, clarify this and I would start magnesium oxied 400mg  daily   J BrancH MD

## 2017-06-18 NOTE — Telephone Encounter (Signed)
Pt will increase potassium to 40 meq daily, and start magnesium oxide 400 mg daily

## 2017-07-12 DIAGNOSIS — Z0000 Encounter for general adult medical examination without abnormal findings: Secondary | ICD-10-CM | POA: Diagnosis not present

## 2017-07-12 DIAGNOSIS — M25512 Pain in left shoulder: Secondary | ICD-10-CM | POA: Diagnosis not present

## 2017-09-12 DIAGNOSIS — H35361 Drusen (degenerative) of macula, right eye: Secondary | ICD-10-CM | POA: Diagnosis not present

## 2017-11-09 ENCOUNTER — Encounter: Payer: Self-pay | Admitting: Cardiology

## 2017-11-09 ENCOUNTER — Ambulatory Visit: Payer: BLUE CROSS/BLUE SHIELD | Admitting: Cardiology

## 2017-11-09 VITALS — BP 155/88 | HR 88 | Ht 63.0 in | Wt 268.0 lb

## 2017-11-09 DIAGNOSIS — I2510 Atherosclerotic heart disease of native coronary artery without angina pectoris: Secondary | ICD-10-CM

## 2017-11-09 DIAGNOSIS — I5032 Chronic diastolic (congestive) heart failure: Secondary | ICD-10-CM

## 2017-11-09 DIAGNOSIS — I10 Essential (primary) hypertension: Secondary | ICD-10-CM

## 2017-11-09 MED ORDER — CHLORTHALIDONE 25 MG PO TABS: 25.0000 mg | ORAL_TABLET | Freq: Every day | ORAL | 3 refills | Status: DC

## 2017-11-09 NOTE — Patient Instructions (Signed)
Medication Instructions:   Your physician has recommended you make the following change in your medication:   Increase chlorthalidone to 25 mg by mouth daily.  Continue all other medications the same.  Labwork:  NONE  Testing/Procedures:  NONE  Follow-Up:  Your physician recommends that you schedule a follow-up appointment in: 6 months. You will receive a reminder letter in the mail in about 4 months reminding you to call and schedule your appointment. If you don't receive this letter, please contact our office.  Any Other Special Instructions Will Be Listed Below (If Applicable).  If you need a refill on your cardiac medications before your next appointment, please call your pharmacy.

## 2017-11-09 NOTE — Progress Notes (Signed)
Clinical Summary Ms. Pyles is a 62 y.o.female seen today for follow up of the following medical problems.     1. Chronic diastolic HF -  DOE at 2 blocks. DOE at 2 flights of stairs - also limited due to chronic foot pain.   2. CAD - history of prior CABG in 2000 - no recent chest pain.    3. HTN  - compliant with meds -  SH: recent cruise to [place]. Enjoys going [place].  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 Original disability claim was denied x2, has contacted an attorned to work on.  Past Medical History:  Diagnosis Date  . Arthritis   . CAD (coronary artery disease)    a. s/p CABG in 2000  . GERD (gastroesophageal reflux disease)   . Hx of adenomatous colonic polyps   . Hypercholesterolemia   . Hypertension   . Hypothyroidism   . Sleep apnea    pt doesn't wear her CPAP machine-PCP knows patient unable to wear CPAP     No Known Allergies   Current Outpatient Medications  Medication Sig Dispense Refill  . aspirin 81 MG tablet Take 81 mg by mouth daily.      Marland Kitchen atorvastatin (LIPITOR) 20 MG tablet Take 20 mg by mouth daily.    . chlorthalidone (HYGROTON) 25 MG tablet Take 0.5 tablets (12.5 mg total) by mouth daily. 45 tablet 3  . furosemide (LASIX) 40 MG tablet Take 40 mg by mouth daily as needed.  5  . levothyroxine (SYNTHROID, LEVOTHROID) 175 MCG tablet Take 175 mcg by mouth daily.     Marland Kitchen losartan (COZAAR) 100 MG tablet Take 100 mg by mouth daily.     . Magnesium Oxide 400 MG CAPS Take 1 capsule (400 mg total) by mouth daily. 90 capsule 3  . naproxen (NAPROSYN) 500 MG tablet Take 500 mg by mouth daily.    . pantoprazole (PROTONIX) 40 MG tablet Take 40 mg by mouth daily.     . potassium chloride SA (K-DUR,KLOR-CON) 20 MEQ tablet Take 2 tablets (40 mEq total) by mouth daily. 180 tablet 3  . verapamil (VERELAN PM) 360 MG 24 hr capsule Take 360 mg by mouth at bedtime.     No current  facility-administered medications for this visit.      Past Surgical History:  Procedure Laterality Date  . ABDOMINAL HYSTERECTOMY    . BONE EXOSTOSIS EXCISION  04/25/2012   Procedure: EXOSTOSIS EXCISION;  Surgeon: Marcheta Grammes, DPM;  Location: [facility];  Service: Orthopedics;  Laterality: Left;  Retrocalcaneal Exostectomy Left Foot  . CATARACT EXTRACTION W/PHACO Right 09/08/2013   Procedure: CATARACT EXTRACTION PHACO AND INTRAOCULAR LENS PLACEMENT (IOC);  Surgeon: Tonny Sanayah Munro, MD;  Location: [facility];  Service: Ophthalmology;  Laterality: Right;  CDE 9.13  . CHOLECYSTECTOMY    . COLONOSCOPY  04/16/07   diminutive rectal polyp removed, scattered diverticula, path: tubular adenoma  . COLONOSCOPY  04/14/02   external hemorrhoids otherwise normal  . COLONOSCOPY  05/24/2011   Dr. Rourk:tubular adenoma and hyperplastic polyp, long redundant colon/left sided diverticulosis/internal hemorrhoids, incomplete examination, unable to reach the cecum despite external abdominal pressure, changing position. Barium enema incomplete.   . COLONOSCOPY N/A 02/05/2015   Procedure: COLONOSCOPY;  Surgeon: Daneil Dolin, MD;  Location: [facility];  Service: Endoscopy;  Laterality: N/A;  115 - moved to 12:15 - office to notify   . CORONARY ARTERY BYPASS GRAFT  2000   triple bypass  . S/P Hysterectomy       No Known Allergies    Family History  Problem Relation Age of Onset  . Colon cancer Mother        diagnosed at age 7, passed away at age 26 from metastasis  . Heart attack Father   . Prostate cancer Brother   . COPD Brother      Social History Ms. Riebe reports that she has been smoking cigarettes.  She has a 7.50 pack-year smoking history. She has never used smokeless tobacco. Ms. Ruda reports that she does not drink alcohol.   Review of Systems CONSTITUTIONAL: No weight loss, fever, chills, weakness or fatigue.  HEENT: Eyes: No visual loss, blurred vision, double vision or yellow  sclerae.No hearing loss, sneezing, congestion, runny nose or sore throat.  SKIN: No rash or itching.  CARDIOVASCULAR: per hpi RESPIRATORY: per hpi GASTROINTESTINAL: No anorexia, nausea, vomiting or diarrhea. No abdominal pain or blood.  GENITOURINARY: No burning on urination, no polyuria NEUROLOGICAL: No headache, dizziness, syncope, paralysis, ataxia, numbness or tingling in the extremities. No change in bowel or bladder control.  MUSCULOSKELETAL: No muscle, back pain, joint pain or stiffness.  LYMPHATICS: No enlarged nodes. No history of splenectomy.  PSYCHIATRIC: No history of depression or anxiety.  ENDOCRINOLOGIC: No reports of sweating, cold or heat intolerance. No polyuria or polydipsia.  Marland Kitchen   Physical Examination Vitals:   11/09/17 1256  BP: (!) 155/88  Pulse: 88  SpO2: 96%   Vitals:   11/09/17 1256  Weight: 268 lb (121.6 kg)  Height: 5\' 3"  (1.6 m)    Gen: resting comfortably, no acute distress HEENT: no scleral icterus, pupils equal round and reactive, no palptable cervical adenopathy,  CV: RRR, no m/r/g, no jvd Resp: Clear to auscultation bilaterally GI: abdomen is soft, non-tender, non-distended, normal bowel sounds, no hepatosplenomegaly MSK: extremities are warm, no edema.  Skin: warm, no rash Neuro:  no focal deficits Psych: appropriate affect   Diagnostic Studies 12/2016 echo Study Conclusions  - Left ventricle: The cavity size was normal. Wall thickness was increased in a pattern of moderate LVH. Systolic function was normal. The estimated ejection fraction was in the range of 55% to 60%. Wall motion was normal; there were no regional wall motion abnormalities. Doppler parameters are consistent with abnormal left ventricular relaxation (grade 1 diastolic dysfunction). Doppler parameters are consistent with high ventricular filling pressure. - Aortic valve: Mildly calcified annulus. Mildly thickened leaflets. Valve area (VTI): 1.96  cm^2. Valve area (Vmax): 1.76 cm^2. - Mitral valve: Mildly calcified annulus. Mildly thickened leaflets . - Technically difficult study.      Assessment and Plan  1. Chronic diastolic HF - some recent fluid retention, increase chlorthalidone to 25mg  daily  2. HTN - above goal, increase chlrothalidone to 25mg  daily.   3. CAD - no recent symptoms, continue current meds   F/u 3 months       Arnoldo Lenis, M.D.

## 2017-11-26 DIAGNOSIS — E785 Hyperlipidemia, unspecified: Secondary | ICD-10-CM | POA: Diagnosis not present

## 2017-11-26 DIAGNOSIS — E069 Thyroiditis, unspecified: Secondary | ICD-10-CM | POA: Diagnosis not present

## 2017-11-26 DIAGNOSIS — I25119 Atherosclerotic heart disease of native coronary artery with unspecified angina pectoris: Secondary | ICD-10-CM | POA: Diagnosis not present

## 2017-11-26 DIAGNOSIS — I10 Essential (primary) hypertension: Secondary | ICD-10-CM | POA: Diagnosis not present

## 2017-11-26 LAB — BASIC METABOLIC PANEL
BUN: 15 (ref 4–21)
Creatinine: 0.9 (ref 0.5–1.1)
Glucose: 123
Sodium: 141 (ref 137–147)

## 2017-11-26 LAB — TSH: TSH: 8.43 — AB (ref <=5.90)

## 2017-12-02 ENCOUNTER — Encounter: Payer: Self-pay | Admitting: Cardiology

## 2017-12-28 ENCOUNTER — Other Ambulatory Visit: Payer: Self-pay | Admitting: Cardiology

## 2018-02-06 DIAGNOSIS — Z23 Encounter for immunization: Secondary | ICD-10-CM | POA: Diagnosis not present

## 2018-04-28 ENCOUNTER — Other Ambulatory Visit: Payer: Self-pay | Admitting: Cardiology

## 2018-05-06 ENCOUNTER — Other Ambulatory Visit: Payer: Self-pay

## 2018-05-06 MED ORDER — POTASSIUM CHLORIDE CRYS ER 20 MEQ PO TBCR: 40.0000 meq | EXTENDED_RELEASE_TABLET | Freq: Every day | ORAL | 3 refills | Status: DC

## 2018-05-16 DIAGNOSIS — I25119 Atherosclerotic heart disease of native coronary artery with unspecified angina pectoris: Secondary | ICD-10-CM | POA: Diagnosis not present

## 2018-05-16 DIAGNOSIS — E668 Other obesity: Secondary | ICD-10-CM | POA: Diagnosis not present

## 2018-05-16 DIAGNOSIS — I10 Essential (primary) hypertension: Secondary | ICD-10-CM | POA: Diagnosis not present

## 2018-05-16 DIAGNOSIS — M79606 Pain in leg, unspecified: Secondary | ICD-10-CM | POA: Diagnosis not present

## 2018-05-21 ENCOUNTER — Ambulatory Visit (HOSPITAL_COMMUNITY)
Admission: RE | Admit: 2018-05-21 | Discharge: 2018-05-21 | Disposition: A | Discharge status: Home / Self Care | Payer: BLUE CROSS/BLUE SHIELD | Source: Ambulatory Visit | Attending: Pulmonary Disease | Admitting: Pulmonary Disease

## 2018-05-21 ENCOUNTER — Encounter: Payer: Self-pay | Admitting: Cardiology

## 2018-05-21 ENCOUNTER — Ambulatory Visit: Payer: BLUE CROSS/BLUE SHIELD | Admitting: Cardiology

## 2018-05-21 ENCOUNTER — Other Ambulatory Visit (HOSPITAL_COMMUNITY): Payer: Self-pay | Admitting: Pulmonary Disease

## 2018-05-21 VITALS — BP 160/84 | HR 76 | Ht 63.0 in | Wt 270.0 lb

## 2018-05-21 DIAGNOSIS — M4726 Other spondylosis with radiculopathy, lumbar region: Secondary | ICD-10-CM | POA: Diagnosis not present

## 2018-05-21 DIAGNOSIS — I2510 Atherosclerotic heart disease of native coronary artery without angina pectoris: Secondary | ICD-10-CM | POA: Diagnosis not present

## 2018-05-21 DIAGNOSIS — I5032 Chronic diastolic (congestive) heart failure: Secondary | ICD-10-CM

## 2018-05-21 DIAGNOSIS — I10 Essential (primary) hypertension: Secondary | ICD-10-CM | POA: Diagnosis not present

## 2018-05-21 DIAGNOSIS — M79604 Pain in right leg: Secondary | ICD-10-CM

## 2018-05-21 DIAGNOSIS — M79605 Pain in left leg: Secondary | ICD-10-CM | POA: Diagnosis not present

## 2018-05-21 MED ORDER — SPIRONOLACTONE 25 MG PO TABS: 25.0000 mg | ORAL_TABLET | Freq: Every day | ORAL | 3 refills | Status: DC

## 2018-05-21 MED ORDER — POTASSIUM CHLORIDE CRYS ER 20 MEQ PO TBCR: 20.0000 meq | EXTENDED_RELEASE_TABLET | Freq: Every day | ORAL | 3 refills | Status: DC

## 2018-05-21 NOTE — Patient Instructions (Signed)
Medication Instructions:  START ALDACTONE 25V MG DAILY    Labwork: 2 WEEKS   Testing/Procedures: NONE  Follow-Up: Your physician wants you to follow-up in: 6 MONTHS . You will receive a reminder letter in the mail two months in advance. If you don't receive a letter, please call our office to schedule the follow-up appointment.   Any Other Special Instructions Will Be Listed Below (If Applicable).     If you need a refill on your cardiac medications before your next appointment, please call your pharmacy.

## 2018-05-21 NOTE — Progress Notes (Signed)
Clinical Summary Mariah Stewart is a 63 y.o.female seen today for follow up of the following medical problems.     1.Chronic diastolic HF - 05/3084 echo LVEF 57-84%, grade I diastolic dysfunction - edema at times, overall improved.   2. CAD - history of prior CABG in 2000 - denies any recent chest pain   3. HTN - compliant with meds  4. Hyperlipidemia - stopped lipitor by pcp due to leg pains Jan 2nd.    SH: recent cruise to [place]. Enjoys going [place]. 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  Past Medical History:  Diagnosis Date  . Arthritis   . CAD (coronary artery disease)    a. s/p CABG in 2000  . GERD (gastroesophageal reflux disease)   . Hx of adenomatous colonic polyps   . Hypercholesterolemia   . Hypertension   . Hypothyroidism   . Sleep apnea    pt doesn't wear her CPAP machine-PCP knows patient unable to wear CPAP     No Known Allergies   Current Outpatient Medications  Medication Sig Dispense Refill  . aspirin 81 MG tablet Take 81 mg by mouth daily.      Marland Kitchen atorvastatin (LIPITOR) 20 MG tablet Take 20 mg by mouth daily.    . chlorthalidone (HYGROTON) 25 MG tablet Take 1 tablet (25 mg total) by mouth daily. 90 tablet 3  . chlorthalidone (HYGROTON) 25 MG tablet Take 1 tablet (25 mg total) by mouth daily. 90 tablet 3  . furosemide (LASIX) 40 MG tablet Take 40 mg by mouth daily as needed.  5  . levothyroxine (SYNTHROID, LEVOTHROID) 175 MCG tablet Take 175 mcg by mouth daily.     Marland Kitchen losartan (COZAAR) 100 MG tablet Take 100 mg by mouth daily.     . Magnesium Oxide 400 MG CAPS Take 1 capsule (400 mg total) by mouth daily. 90 capsule 3  . naproxen (NAPROSYN) 500 MG tablet Take 500 mg by mouth daily.    . pantoprazole (PROTONIX) 40 MG tablet Take 40 mg by mouth daily.     . potassium chloride SA (K-DUR,KLOR-CON)  20 MEQ tablet Take 2 tablets (40 mEq total) by mouth daily. 180 tablet 3  . verapamil (VERELAN PM) 360 MG 24 hr capsule Take 360 mg by mouth at bedtime.     No current facility-administered medications for this visit.      Past Surgical History:  Procedure Laterality Date  . ABDOMINAL HYSTERECTOMY    . BONE EXOSTOSIS EXCISION  04/25/2012   Procedure: EXOSTOSIS EXCISION;  Surgeon: Marcheta Grammes, DPM;  Location: [facility];  Service: Orthopedics;  Laterality: Left;  Retrocalcaneal Exostectomy Left Foot  . CATARACT EXTRACTION W/PHACO Right 09/08/2013   Procedure: CATARACT EXTRACTION PHACO AND INTRAOCULAR LENS PLACEMENT (IOC);  Surgeon: Tonny Irish Piech, MD;  Location: [facility];  Service: Ophthalmology;  Laterality: Right;  CDE 9.13  . CHOLECYSTECTOMY    . COLONOSCOPY  04/16/07   diminutive rectal polyp removed, scattered diverticula, path: tubular adenoma  . COLONOSCOPY  04/14/02   external hemorrhoids otherwise normal  . COLONOSCOPY  05/24/2011   Dr. Rourk:tubular adenoma and hyperplastic polyp, long redundant colon/left sided diverticulosis/internal hemorrhoids, incomplete examination, unable to reach the cecum despite external abdominal pressure, changing position. Barium enema incomplete.   . COLONOSCOPY N/A 02/05/2015   Procedure: COLONOSCOPY;  Surgeon: Daneil Dolin, MD;  Location: [facility];  Service: Endoscopy;  Laterality: N/A;  115 - moved to 12:15 - office to notify   . CORONARY ARTERY BYPASS GRAFT  2000   triple bypass  . S/P Hysterectomy       No Known Allergies    Family History  Problem Relation Age of Onset  . Colon cancer Mother        diagnosed at age 65, passed away at age 37 from metastasis  . Heart attack Father   . Prostate cancer Brother   . COPD Brother      Social History Ms. Bugh reports that she has been smoking cigarettes. She has a 7.50 pack-year smoking history. She has never used smokeless tobacco. Ms. Calvario reports no history of alcohol  use.   Review of Systems CONSTITUTIONAL: No weight loss, fever, chills, weakness or fatigue.  HEENT: Eyes: No visual loss, blurred vision, double vision or yellow sclerae.No hearing loss, sneezing, congestion, runny nose or sore throat.  SKIN: No rash or itching.  CARDIOVASCULAR: per hpi RESPIRATORY: No shortness of breath, cough or sputum.  GASTROINTESTINAL: No anorexia, nausea, vomiting or diarrhea. No abdominal pain or blood.  GENITOURINARY: No burning on urination, no polyuria NEUROLOGICAL: No headache, dizziness, syncope, paralysis, ataxia, numbness or tingling in the extremities. No change in bowel or bladder control.  MUSCULOSKELETAL: No muscle, back pain, joint pain or stiffness.  LYMPHATICS: No enlarged nodes. No history of splenectomy.  PSYCHIATRIC: No history of depression or anxiety.  ENDOCRINOLOGIC: No reports of sweating, cold or heat intolerance. No polyuria or polydipsia.  Marland Kitchen   Physical Examination Vitals:   05/21/18 1424  BP: (!) 160/84  Pulse: 76  SpO2: 94%   Vitals:   05/21/18 1424  Weight: 270 lb (122.5 kg)  Height: 5\' 3"  (1.6 m)    Gen: resting comfortably, no acute distress HEENT: no scleral icterus, pupils equal round and reactive, no palptable cervical adenopathy,  CV: RRR, no m/r/g, no jvd Resp: Clear to auscultation bilaterally GI: abdomen is soft, non-tender, non-distended, normal bowel sounds, no hepatosplenomegaly MSK: extremities are warm,1+ bilateral LE edema Skin: warm, no rash Neuro:  no focal deficits Psych: appropriate affect   Diagnostic Studies 12/2016 echo Study Conclusions  - Left ventricle: The cavity size was normal. Wall thickness was increased in a pattern of moderate LVH. Systolic function was normal. The estimated ejection fraction was in the range of 55% to 60%. Wall motion was normal; there were no regional wall motion abnormalities. Doppler parameters are consistent with abnormal left ventricular relaxation  (grade 1 diastolic dysfunction). Doppler parameters are consistent with high ventricular filling pressure. - Aortic valve: Mildly calcified annulus. Mildly thickened leaflets. Valve area (VTI): 1.96 cm^2. Valve area (Vmax): 1.76 cm^2. - Mitral valve: Mildly calcified annulus. Mildly thickened leaflets . - Technically difficult study.    Assessment and Plan  1. Chronic diastolic HF - continue current diuretics, daily chlorthalidone for edema and HTN and prn lasix.   2. Resistant HTN - bp's remain elevated, manual bp 150/90 - start aldactone 25mg  daily in setting of resistant HTN, recheck BMET/Mg in 2 weeks. Currently taking KCl 10mEq daily, may need adjustment or d/c pending upcoming labs.   3. CAD - no symptoms, continue current meds - EKG today shows sinus rhythm, no ischemic changes  F/u 6 months   Arnoldo Lenis, M.D.

## 2018-07-23 DIAGNOSIS — Z0000 Encounter for general adult medical examination without abnormal findings: Secondary | ICD-10-CM | POA: Diagnosis not present

## 2018-07-23 DIAGNOSIS — Z23 Encounter for immunization: Secondary | ICD-10-CM | POA: Diagnosis not present

## 2018-10-05 ENCOUNTER — Other Ambulatory Visit: Payer: Self-pay | Admitting: Cardiology

## 2018-10-28 ENCOUNTER — Other Ambulatory Visit (HOSPITAL_COMMUNITY)
Admission: RE | Admit: 2018-10-28 | Discharge: 2018-10-28 | Disposition: A | Discharge status: Home / Self Care | Payer: BC Managed Care – PPO | Source: Ambulatory Visit | Attending: Pulmonary Disease | Admitting: Pulmonary Disease

## 2018-10-28 ENCOUNTER — Other Ambulatory Visit (HOSPITAL_COMMUNITY)
Admission: RE | Admit: 2018-10-28 | Discharge: 2018-10-28 | Disposition: A | Discharge status: Home / Self Care | Payer: BC Managed Care – PPO | Source: Ambulatory Visit | Attending: Cardiology | Admitting: Cardiology

## 2018-10-28 DIAGNOSIS — I10 Essential (primary) hypertension: Secondary | ICD-10-CM | POA: Insufficient documentation

## 2018-10-28 LAB — HEMOGLOBIN A1C
Hgb A1c MFr Bld: 6.5 % — ABNORMAL HIGH (ref 4.8–5.6)
Mean Plasma Glucose: 139.85 mg/dL

## 2018-10-28 LAB — CBC WITH DIFFERENTIAL/PLATELET
Abs Immature Granulocytes: 0.05 10*3/uL (ref 0.00–0.07)
Basophils Absolute: 0.1 10*3/uL (ref 0.0–0.1)
Basophils Relative: 1 %
Eosinophils Absolute: 0.4 10*3/uL (ref 0.0–0.5)
Eosinophils Relative: 4 %
HCT: 39.1 % (ref 36.0–46.0)
Hemoglobin: 12.3 g/dL (ref 12.0–15.0)
Immature Granulocytes: 1 %
Lymphocytes Relative: 26 %
Lymphs Abs: 2.6 10*3/uL (ref 0.7–4.0)
MCH: 30.5 pg (ref 26.0–34.0)
MCHC: 31.5 g/dL (ref 30.0–36.0)
MCV: 97 fL (ref 80.0–100.0)
Monocytes Absolute: 0.5 10*3/uL (ref 0.1–1.0)
Monocytes Relative: 5 %
Neutro Abs: 6.4 10*3/uL (ref 1.7–7.7)
Neutrophils Relative %: 63 %
Platelets: 196 10*3/uL (ref 150–400)
RBC: 4.03 MIL/uL (ref 3.87–5.11)
RDW: 14.2 % (ref 11.5–15.5)
WBC: 10 10*3/uL (ref 4.0–10.5)
nRBC: 0 % (ref 0.0–0.2)

## 2018-10-28 LAB — COMPREHENSIVE METABOLIC PANEL
ALT: 23 U/L (ref 0–44)
AST: 23 U/L (ref 15–41)
Albumin: 3.7 g/dL (ref 3.5–5.0)
Alkaline Phosphatase: 83 U/L (ref 38–126)
Anion gap: 11 (ref 5–15)
BUN: 17 mg/dL (ref 8–23)
CO2: 25 mmol/L (ref 22–32)
Calcium: 10 mg/dL (ref 8.9–10.3)
Chloride: 106 mmol/L (ref 98–111)
Creatinine, Ser: 1.09 mg/dL — ABNORMAL HIGH (ref 0.44–1.00)
GFR calc Af Amer: >60 mL/min (ref >60)
GFR calc non Af Amer: 54 mL/min — ABNORMAL LOW (ref >60)
Glucose, Bld: 130 mg/dL — ABNORMAL HIGH (ref 70–99)
Potassium: 4.4 mmol/L (ref 3.5–5.1)
Sodium: 142 mmol/L (ref 135–145)
Total Bilirubin: 0.5 mg/dL (ref 0.3–1.2)
Total Protein: 7.8 g/dL (ref 6.5–8.1)

## 2018-10-28 LAB — LIPID PANEL
Cholesterol: 290 mg/dL — ABNORMAL HIGH (ref 0–200)
HDL: 35 mg/dL — ABNORMAL LOW (ref >40)
LDL Cholesterol: 218 mg/dL — ABNORMAL HIGH (ref 0–99)
Total CHOL/HDL Ratio: 8.3 RATIO
Triglycerides: 185 mg/dL — ABNORMAL HIGH (ref <150)
VLDL: 37 mg/dL (ref 0–40)

## 2018-10-28 LAB — TSH: TSH: 15.993 u[IU]/mL — ABNORMAL HIGH (ref 0.350–4.500)

## 2018-10-28 LAB — MAGNESIUM: Magnesium: 1.9 mg/dL (ref 1.7–2.4)

## 2018-10-31 DIAGNOSIS — I25119 Atherosclerotic heart disease of native coronary artery with unspecified angina pectoris: Secondary | ICD-10-CM | POA: Diagnosis not present

## 2018-10-31 DIAGNOSIS — E039 Hypothyroidism, unspecified: Secondary | ICD-10-CM | POA: Diagnosis not present

## 2018-10-31 DIAGNOSIS — K219 Gastro-esophageal reflux disease without esophagitis: Secondary | ICD-10-CM | POA: Diagnosis not present

## 2018-10-31 DIAGNOSIS — E785 Hyperlipidemia, unspecified: Secondary | ICD-10-CM | POA: Diagnosis not present

## 2019-01-27 ENCOUNTER — Telehealth: Payer: Self-pay | Admitting: Cardiology

## 2019-01-27 ENCOUNTER — Encounter: Payer: Self-pay | Admitting: Cardiology

## 2019-01-27 ENCOUNTER — Other Ambulatory Visit: Payer: Self-pay

## 2019-01-27 ENCOUNTER — Ambulatory Visit (INDEPENDENT_AMBULATORY_CARE_PROVIDER_SITE_OTHER): Payer: BC Managed Care – PPO | Admitting: Cardiology

## 2019-01-27 VITALS — BP 127/71 | HR 85 | Temp 97.1°F | Ht 63.0 in | Wt 263.0 lb

## 2019-01-27 DIAGNOSIS — I10 Essential (primary) hypertension: Secondary | ICD-10-CM | POA: Diagnosis not present

## 2019-01-27 DIAGNOSIS — I2510 Atherosclerotic heart disease of native coronary artery without angina pectoris: Secondary | ICD-10-CM | POA: Diagnosis not present

## 2019-01-27 DIAGNOSIS — I5032 Chronic diastolic (congestive) heart failure: Secondary | ICD-10-CM | POA: Diagnosis not present

## 2019-01-27 MED ORDER — ROSUVASTATIN CALCIUM 10 MG PO TABS: 10.0000 mg | ORAL_TABLET | Freq: Every day | ORAL | 3 refills | Status: DC

## 2019-01-27 NOTE — Progress Notes (Signed)
Clinical Summary Ms. Heiden is a 63 y.o.female seen today for follow up of the following medical problems.   1.Chronic diastolic HF - [id number] echo LVEF 0000000, grade I diastolic dysfunction   - no recent edema. No SOB/DOE.    2. CAD - history of prior CABG in 2000  - no recent chest pain, no SOB/DOE.   3. HTN - she is compliant with meds  4. Hyperlipidemia - stopped lipitor by pcp due to leg pains Jan 2nd.  10/2018 lipid panel LDL 218 - started on crestor at that time which she is tolerating.   - has repeat blood work this month.    SH: recent cruise to [place]. Enjoys going [place]. 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   Past Medical History:  Diagnosis Date  . Arthritis   . CAD (coronary artery disease)    a. s/p CABG in 2000  . GERD (gastroesophageal reflux disease)   . Hx of adenomatous colonic polyps   . Hypercholesterolemia   . Hypertension   . Hypothyroidism   . Sleep apnea    pt doesn't wear her CPAP machine-PCP knows patient unable to wear CPAP     No Known Allergies   Current Outpatient Medications  Medication Sig Dispense Refill  . aspirin 81 MG tablet Take 81 mg by mouth daily.      . chlorthalidone (HYGROTON) 25 MG tablet Take 1 tablet (25 mg total) by mouth daily. 90 tablet 3  . furosemide (LASIX) 40 MG tablet Take 40 mg by mouth daily as needed.  5  . levothyroxine (SYNTHROID, LEVOTHROID) 175 MCG tablet Take 175 mcg by mouth daily.     Marland Kitchen losartan (COZAAR) 100 MG tablet Take 100 mg by mouth daily.     . Magnesium Oxide -Mg Supplement 400 MG CAPS TAKE 1 CAPSULE BY MOUTH DAILY. 90 capsule 3  . naproxen (NAPROSYN) 500 MG tablet Take 500 mg by mouth daily.    . pantoprazole (PROTONIX) 40 MG tablet Take 40 mg by mouth daily.     . potassium chloride SA (K-DUR,KLOR-CON) 20 MEQ tablet Take 1  tablet (20 mEq total) by mouth daily. 90 tablet 3  . spironolactone (ALDACTONE) 25 MG tablet Take 1 tablet (25 mg total) by mouth daily. 90 tablet 3  . verapamil (VERELAN PM) 360 MG 24 hr capsule Take 360 mg by mouth at bedtime.     No current facility-administered medications for this visit.      Past Surgical History:  Procedure Laterality Date  . ABDOMINAL HYSTERECTOMY    . BONE EXOSTOSIS EXCISION  04/25/2012   Procedure: EXOSTOSIS EXCISION;  Surgeon: Marcheta Grammes, DPM;  Location: [facility];  Service: Orthopedics;  Laterality: Left;  Retrocalcaneal Exostectomy Left Foot  . CATARACT EXTRACTION W/PHACO Right 09/08/2013   Procedure: CATARACT EXTRACTION PHACO AND INTRAOCULAR LENS PLACEMENT (IOC);  Surgeon: Tonny Charlese Gruetzmacher, MD;  Location: [facility];  Service: Ophthalmology;  Laterality: Right;  CDE 9.13  . CHOLECYSTECTOMY    . COLONOSCOPY  04/16/07   diminutive rectal polyp removed, scattered diverticula, path: tubular adenoma  . COLONOSCOPY  04/14/02   external hemorrhoids otherwise normal  . COLONOSCOPY  05/24/2011   Dr. Rourk:tubular adenoma and hyperplastic polyp, long redundant colon/left sided diverticulosis/internal hemorrhoids, incomplete examination, unable to reach the cecum despite external abdominal pressure, changing position. Barium enema incomplete.   Marland Kitchen  COLONOSCOPY N/A 02/05/2015   Procedure: COLONOSCOPY;  Surgeon: Daneil Dolin, MD;  Location: [facility];  Service: Endoscopy;  Laterality: N/A;  115 - moved to 12:15 - office to notify   . CORONARY ARTERY BYPASS GRAFT  2000   triple bypass  . S/P Hysterectomy       No Known Allergies    Family History  Problem Relation Age of Onset  . Colon cancer Mother        diagnosed at age 73, passed away at age 61 from metastasis  . Heart attack Father   . Prostate cancer Brother   . COPD Brother      Social History Ms. Gowell reports that she has been smoking cigarettes. She has a 7.50 pack-year smoking history. She has  never used smokeless tobacco. Ms. Hafler reports no history of alcohol use.   Review of Systems CONSTITUTIONAL: No weight loss, fever, chills, weakness or fatigue.  HEENT: Eyes: No visual loss, blurred vision, double vision or yellow sclerae.No hearing loss, sneezing, congestion, runny nose or sore throat.  SKIN: No rash or itching.  CARDIOVASCULAR: per hpi RESPIRATORY: No shortness of breath, cough or sputum.  GASTROINTESTINAL: No anorexia, nausea, vomiting or diarrhea. No abdominal pain or blood.  GENITOURINARY: No burning on urination, no polyuria NEUROLOGICAL: No headache, dizziness, syncope, paralysis, ataxia, numbness or tingling in the extremities. No change in bowel or bladder control.  MUSCULOSKELETAL: No muscle, back pain, joint pain or stiffness.  LYMPHATICS: No enlarged nodes. No history of splenectomy.  PSYCHIATRIC: No history of depression or anxiety.  ENDOCRINOLOGIC: No reports of sweating, cold or heat intolerance. No polyuria or polydipsia.  Marland Kitchen   Physical Examination Today's Vitals   01/27/19 1512  BP: 127/71  Pulse: 85  Temp: (!) 97.1 F (36.2 C)  TempSrc: Temporal  SpO2: 96%  Weight: 263 lb (119.3 kg)  Height: 5\' 3"  (1.6 m)   Body mass index is 46.59 kg/m.  Gen: resting comfortably, no acute distress HEENT: no scleral icterus, pupils equal round and reactive, no palptable cervical adenopathy,  CV: RRR, no m/r/g, no jvd Resp: Clear to auscultation bilaterally GI: abdomen is soft, non-tender, non-distended, normal bowel sounds, no hepatosplenomegaly MSK: extremities are warm, no edema.  Skin: warm, no rash Neuro:  no focal deficits Psych: appropriate affect   Diagnostic Studies 12/2016 echo Study Conclusions  - Left ventricle: The cavity size was normal. Wall thickness was increased in a pattern of moderate LVH. Systolic function was normal. The estimated ejection fraction was in the range of 55% to 60%. Wall motion was normal; there were no  regional wall motion abnormalities. Doppler parameters are consistent with abnormal left ventricular relaxation (grade 1 diastolic dysfunction). Doppler parameters are consistent with high ventricular filling pressure. - Aortic valve: Mildly calcified annulus. Mildly thickened leaflets. Valve area (VTI): 1.96 cm^2. Valve area (Vmax): 1.76 cm^2. - Mitral valve: Mildly calcified annulus. Mildly thickened leaflets . - Technically difficult study.    Assessment and Plan  1. Chronic diastolic HF - no recent issues, continue current meds  2. Resistant HTN - controlled, continue current meds  3. CAD - no symptoms, continue current meds  4. Hyperlipidemia - continue statin, upcoming labs with pcp. Titrate crestor if needed .     Arnoldo Lenis, M.D.

## 2019-01-27 NOTE — Patient Instructions (Signed)
Medication Instructions: Your physician recommends that you continue on your current medications as directed. Please refer to the Current Medication list given to you today.   Labwork: None today  Procedures/Testing: none today  Follow-Up: 6 months with Dr.Branch  Any Additional Special Instructions Will Be Listed Below (If Applicable).     If you need a refill on your cardiac medications before your next appointment, please call your pharmacy.     Thank you for choosing [facility] !

## 2019-01-27 NOTE — Telephone Encounter (Signed)

## 2019-01-31 ENCOUNTER — Other Ambulatory Visit (HOSPITAL_COMMUNITY)
Admission: RE | Admit: 2019-01-31 | Discharge: 2019-01-31 | Disposition: A | Discharge status: Home / Self Care | Payer: BC Managed Care – PPO | Source: Ambulatory Visit | Attending: Pulmonary Disease | Admitting: Pulmonary Disease

## 2019-01-31 ENCOUNTER — Other Ambulatory Visit: Payer: Self-pay | Admitting: Cardiology

## 2019-01-31 DIAGNOSIS — K219 Gastro-esophageal reflux disease without esophagitis: Secondary | ICD-10-CM | POA: Insufficient documentation

## 2019-01-31 DIAGNOSIS — E039 Hypothyroidism, unspecified: Secondary | ICD-10-CM | POA: Diagnosis not present

## 2019-01-31 DIAGNOSIS — E785 Hyperlipidemia, unspecified: Secondary | ICD-10-CM | POA: Insufficient documentation

## 2019-01-31 LAB — LIPID PANEL
Cholesterol: 164 mg/dL (ref 0–200)
HDL: 30 mg/dL — ABNORMAL LOW (ref >40)
LDL Cholesterol: 101 mg/dL — ABNORMAL HIGH (ref 0–99)
Total CHOL/HDL Ratio: 5.5 RATIO
Triglycerides: 166 mg/dL — ABNORMAL HIGH (ref <150)
VLDL: 33 mg/dL (ref 0–40)

## 2019-01-31 LAB — TSH: TSH: 2.98 u[IU]/mL (ref 0.350–4.500)

## 2019-02-04 DIAGNOSIS — M545 Low back pain: Secondary | ICD-10-CM | POA: Diagnosis not present

## 2019-02-04 DIAGNOSIS — I10 Essential (primary) hypertension: Secondary | ICD-10-CM | POA: Diagnosis not present

## 2019-02-04 DIAGNOSIS — I2510 Atherosclerotic heart disease of native coronary artery without angina pectoris: Secondary | ICD-10-CM | POA: Diagnosis not present

## 2019-02-04 DIAGNOSIS — Z23 Encounter for immunization: Secondary | ICD-10-CM | POA: Diagnosis not present

## 2019-02-04 DIAGNOSIS — E785 Hyperlipidemia, unspecified: Secondary | ICD-10-CM | POA: Diagnosis not present

## 2019-04-27 DIAGNOSIS — I25119 Atherosclerotic heart disease of native coronary artery with unspecified angina pectoris: Secondary | ICD-10-CM | POA: Insufficient documentation

## 2019-04-27 DIAGNOSIS — E039 Hypothyroidism, unspecified: Secondary | ICD-10-CM | POA: Insufficient documentation

## 2019-04-27 DIAGNOSIS — G4730 Sleep apnea, unspecified: Secondary | ICD-10-CM | POA: Insufficient documentation

## 2019-04-27 DIAGNOSIS — F17200 Nicotine dependence, unspecified, uncomplicated: Secondary | ICD-10-CM | POA: Insufficient documentation

## 2019-04-27 DIAGNOSIS — I10 Essential (primary) hypertension: Secondary | ICD-10-CM | POA: Insufficient documentation

## 2019-04-27 DIAGNOSIS — N951 Menopausal and female climacteric states: Secondary | ICD-10-CM | POA: Insufficient documentation

## 2019-04-27 DIAGNOSIS — I872 Venous insufficiency (chronic) (peripheral): Secondary | ICD-10-CM | POA: Insufficient documentation

## 2019-04-27 DIAGNOSIS — E668 Other obesity: Secondary | ICD-10-CM

## 2019-04-27 DIAGNOSIS — M79671 Pain in right foot: Secondary | ICD-10-CM

## 2019-04-27 DIAGNOSIS — E785 Hyperlipidemia, unspecified: Secondary | ICD-10-CM | POA: Insufficient documentation

## 2019-05-01 ENCOUNTER — Other Ambulatory Visit: Payer: Self-pay | Admitting: Cardiology

## 2019-06-22 ENCOUNTER — Ambulatory Visit: Payer: Medicare Other | Attending: Internal Medicine

## 2019-06-22 ENCOUNTER — Other Ambulatory Visit: Payer: Self-pay

## 2019-06-22 DIAGNOSIS — Z23 Encounter for immunization: Secondary | ICD-10-CM

## 2019-06-22 NOTE — Progress Notes (Signed)
   Covid-19 Vaccination Clinic  Name:  Mariah Stewart    MRN: [id number] DOB: 05-Feb-1956  06/22/2019  [name] was observed post Covid-19 immunization for 15 minutes without incidence. She was provided with Vaccine Information Sheet and instruction to access the V-Safe system.   [name] was instructed to call 911 with any severe reactions post vaccine: Marland Kitchen Difficulty breathing  . Swelling of your face and throat  . A fast heartbeat  . A bad rash all over your body  . Dizziness and weakness    Immunizations Administered    Name Date Dose VIS Date Route   Moderna COVID-19 Vaccine 06/22/2019  1:02 PM 0.5 mL 04/15/2019 Intramuscular   Manufacturer: Moderna   Lot: [id number]   [id number]

## 2019-07-23 ENCOUNTER — Other Ambulatory Visit: Payer: Self-pay

## 2019-07-23 ENCOUNTER — Ambulatory Visit: Payer: Medicare Other | Attending: Internal Medicine

## 2019-07-23 DIAGNOSIS — Z23 Encounter for immunization: Secondary | ICD-10-CM | POA: Insufficient documentation

## 2019-07-23 NOTE — Progress Notes (Signed)
   Covid-19 Vaccination Clinic  Name:  Mariah Stewart    MRN: [id number] DOB: [birth year]  07/23/2019  Mariah Stewart was observed post Covid-19 immunization for 15 minutes without incident. She was provided with Vaccine Information Sheet and instruction to access the V-Safe system.   Mariah Stewart was instructed to call 911 with any severe reactions post vaccine: Marland Kitchen Difficulty breathing  . Swelling of face and throat  . A fast heartbeat  . A bad rash all over body  . Dizziness and weakness   Immunizations Administered    Name Date Dose VIS Date Route   Moderna COVID-19 Vaccine 07/23/2019  1:25 PM 0.5 mL 04/15/2019 Intramuscular   Manufacturer: Moderna   Lot: [id number]   [id number]

## 2019-08-04 ENCOUNTER — Ambulatory Visit: Payer: BC Managed Care – PPO | Admitting: Family Medicine

## 2019-08-21 DIAGNOSIS — I10 Essential (primary) hypertension: Secondary | ICD-10-CM | POA: Diagnosis not present

## 2019-08-21 DIAGNOSIS — E785 Hyperlipidemia, unspecified: Secondary | ICD-10-CM | POA: Diagnosis not present

## 2019-08-21 DIAGNOSIS — E039 Hypothyroidism, unspecified: Secondary | ICD-10-CM | POA: Diagnosis not present

## 2019-08-21 DIAGNOSIS — Z0189 Encounter for other specified special examinations: Secondary | ICD-10-CM | POA: Diagnosis not present

## 2019-08-21 DIAGNOSIS — E876 Hypokalemia: Secondary | ICD-10-CM | POA: Diagnosis not present

## 2019-09-17 DIAGNOSIS — I10 Essential (primary) hypertension: Secondary | ICD-10-CM | POA: Diagnosis not present

## 2019-09-17 DIAGNOSIS — E039 Hypothyroidism, unspecified: Secondary | ICD-10-CM | POA: Diagnosis not present

## 2019-09-17 DIAGNOSIS — R7303 Prediabetes: Secondary | ICD-10-CM | POA: Diagnosis not present

## 2019-09-17 DIAGNOSIS — K219 Gastro-esophageal reflux disease without esophagitis: Secondary | ICD-10-CM | POA: Diagnosis not present

## 2019-09-17 DIAGNOSIS — E876 Hypokalemia: Secondary | ICD-10-CM | POA: Diagnosis not present

## 2019-09-22 ENCOUNTER — Other Ambulatory Visit (HOSPITAL_COMMUNITY): Payer: Self-pay | Admitting: Internal Medicine

## 2019-09-22 DIAGNOSIS — Z13820 Encounter for screening for osteoporosis: Secondary | ICD-10-CM

## 2019-09-22 DIAGNOSIS — K219 Gastro-esophageal reflux disease without esophagitis: Secondary | ICD-10-CM | POA: Diagnosis not present

## 2019-09-22 DIAGNOSIS — I10 Essential (primary) hypertension: Secondary | ICD-10-CM | POA: Diagnosis not present

## 2019-09-22 DIAGNOSIS — E785 Hyperlipidemia, unspecified: Secondary | ICD-10-CM | POA: Diagnosis not present

## 2019-09-22 DIAGNOSIS — E876 Hypokalemia: Secondary | ICD-10-CM | POA: Diagnosis not present

## 2019-09-22 DIAGNOSIS — Z1231 Encounter for screening mammogram for malignant neoplasm of breast: Secondary | ICD-10-CM

## 2019-09-22 DIAGNOSIS — E039 Hypothyroidism, unspecified: Secondary | ICD-10-CM | POA: Diagnosis not present

## 2019-10-02 ENCOUNTER — Other Ambulatory Visit (HOSPITAL_COMMUNITY): Payer: Medicare Other

## 2019-10-02 ENCOUNTER — Ambulatory Visit (HOSPITAL_COMMUNITY): Payer: Medicare Other

## 2019-10-09 ENCOUNTER — Other Ambulatory Visit: Payer: Self-pay

## 2019-10-09 ENCOUNTER — Ambulatory Visit (HOSPITAL_COMMUNITY)
Admission: RE | Admit: 2019-10-09 | Discharge: 2019-10-09 | Disposition: A | Discharge status: Home / Self Care | Payer: Medicare Other | Source: Ambulatory Visit | Attending: Internal Medicine | Admitting: Internal Medicine

## 2019-10-09 DIAGNOSIS — M85851 Other specified disorders of bone density and structure, right thigh: Secondary | ICD-10-CM | POA: Insufficient documentation

## 2019-10-09 DIAGNOSIS — Z13820 Encounter for screening for osteoporosis: Secondary | ICD-10-CM

## 2019-10-09 DIAGNOSIS — Z1231 Encounter for screening mammogram for malignant neoplasm of breast: Secondary | ICD-10-CM | POA: Diagnosis not present

## 2019-10-20 ENCOUNTER — Other Ambulatory Visit (HOSPITAL_COMMUNITY): Payer: Self-pay | Admitting: Internal Medicine

## 2019-10-20 DIAGNOSIS — R928 Other abnormal and inconclusive findings on diagnostic imaging of breast: Secondary | ICD-10-CM

## 2019-11-04 ENCOUNTER — Ambulatory Visit (HOSPITAL_COMMUNITY)
Admission: RE | Admit: 2019-11-04 | Discharge: 2019-11-04 | Disposition: A | Discharge status: Home / Self Care | Payer: Medicare Other | Source: Ambulatory Visit | Attending: Internal Medicine | Admitting: Internal Medicine

## 2019-11-04 ENCOUNTER — Other Ambulatory Visit: Payer: Self-pay

## 2019-11-04 DIAGNOSIS — R928 Other abnormal and inconclusive findings on diagnostic imaging of breast: Secondary | ICD-10-CM

## 2019-11-04 DIAGNOSIS — N6323 Unspecified lump in the left breast, lower outer quadrant: Secondary | ICD-10-CM | POA: Diagnosis not present

## 2019-11-05 ENCOUNTER — Other Ambulatory Visit (HOSPITAL_COMMUNITY): Payer: Self-pay | Admitting: Internal Medicine

## 2019-11-05 DIAGNOSIS — N6320 Unspecified lump in the left breast, unspecified quadrant: Secondary | ICD-10-CM

## 2019-11-11 ENCOUNTER — Other Ambulatory Visit (HOSPITAL_COMMUNITY): Payer: Self-pay | Admitting: Internal Medicine

## 2019-11-11 ENCOUNTER — Ambulatory Visit (HOSPITAL_COMMUNITY)
Admission: RE | Admit: 2019-11-11 | Discharge: 2019-11-11 | Disposition: A | Discharge status: Home / Self Care | Payer: Medicare Other | Source: Ambulatory Visit | Attending: Internal Medicine | Admitting: Internal Medicine

## 2019-11-11 ENCOUNTER — Other Ambulatory Visit: Payer: Self-pay

## 2019-11-11 DIAGNOSIS — N6323 Unspecified lump in the left breast, lower outer quadrant: Secondary | ICD-10-CM | POA: Insufficient documentation

## 2019-11-11 DIAGNOSIS — N6320 Unspecified lump in the left breast, unspecified quadrant: Secondary | ICD-10-CM | POA: Diagnosis present

## 2019-11-11 DIAGNOSIS — N6082 Other benign mammary dysplasias of left breast: Secondary | ICD-10-CM | POA: Diagnosis not present

## 2019-11-11 DIAGNOSIS — N6489 Other specified disorders of breast: Secondary | ICD-10-CM | POA: Insufficient documentation

## 2019-11-11 DIAGNOSIS — N6012 Diffuse cystic mastopathy of left breast: Secondary | ICD-10-CM | POA: Diagnosis not present

## 2019-11-11 HISTORY — PX: BREAST BIOPSY: SHX20

## 2019-11-12 LAB — SURGICAL PATHOLOGY

## 2019-12-01 DIAGNOSIS — K219 Gastro-esophageal reflux disease without esophagitis: Secondary | ICD-10-CM | POA: Diagnosis not present

## 2019-12-31 DIAGNOSIS — Z0000 Encounter for general adult medical examination without abnormal findings: Secondary | ICD-10-CM | POA: Diagnosis not present

## 2019-12-31 DIAGNOSIS — I10 Essential (primary) hypertension: Secondary | ICD-10-CM | POA: Diagnosis not present

## 2019-12-31 DIAGNOSIS — E039 Hypothyroidism, unspecified: Secondary | ICD-10-CM | POA: Diagnosis not present

## 2019-12-31 DIAGNOSIS — R7301 Impaired fasting glucose: Secondary | ICD-10-CM | POA: Diagnosis not present

## 2020-01-05 DIAGNOSIS — E785 Hyperlipidemia, unspecified: Secondary | ICD-10-CM | POA: Diagnosis not present

## 2020-01-05 DIAGNOSIS — E039 Hypothyroidism, unspecified: Secondary | ICD-10-CM | POA: Diagnosis not present

## 2020-01-05 DIAGNOSIS — K219 Gastro-esophageal reflux disease without esophagitis: Secondary | ICD-10-CM | POA: Diagnosis not present

## 2020-01-05 DIAGNOSIS — I10 Essential (primary) hypertension: Secondary | ICD-10-CM | POA: Diagnosis not present

## 2020-01-05 DIAGNOSIS — E876 Hypokalemia: Secondary | ICD-10-CM | POA: Diagnosis not present

## 2020-01-22 ENCOUNTER — Encounter: Payer: Self-pay | Admitting: Internal Medicine

## 2020-02-06 ENCOUNTER — Other Ambulatory Visit: Payer: Self-pay | Admitting: Cardiology

## 2020-02-11 ENCOUNTER — Ambulatory Visit: Payer: Medicare Other | Admitting: Gastroenterology

## 2020-03-08 DIAGNOSIS — Z23 Encounter for immunization: Secondary | ICD-10-CM | POA: Diagnosis not present

## 2020-03-14 NOTE — Progress Notes (Addendum)
Referring Provider: Celene Squibb, MD Primary Care Physician:  Celene Squibb, MD Primary Gastroenterologist:  Dr. Gala Romney  Chief Complaint  Patient presents with  . no appetite    weighed 260s 04/2019    HPI:   Mariah Stewart is a 64 y.o. female presenting today at the request of Celene Squibb, MD for for early satiety. We have not see patient since 2016. Personal history of colonic adenomas and family history of colon cancer in mother, diagnosed in her 38s. Last colonoscopy in September 2016 with elongated colon, redundant colon-incomplete with inability to reach to deeply intubate the cecum despite external pressure and changing the patients position. Recommended air contrast barium enema. This was never completed. Prior air-contrast barium enema in 2013 was incomplete. Prep was adequate. Suspect this was likely secondary to redundant and tortuous colon.  Today:  Weight down from 263 in September 2020 to 216 lbs today. OV with PCP 01/05/2020, she weighed 226. Has not been trying to lose weight. States she had a bad spellof acid reflux and was afraid to eat anything. This was about 3-4 months ago. She was on Protonix and this was changed to omeprazole 40 mg daily.  Omeprazole is working well, but her appetite has not returned.  Admits to early satiety. No nausea or vomiting. No abdominal pain. No dysphagia. Limiting fried foods. Went to soups and salads when she was struggling with reflux. She used to eat fried foods frequently.   No blood in the stool or black stools. Over the last couple of years, she has noticed BMs are only occurring every 2-3 days rather than daily. Passing small little balls of stool but they are soft. BMs are incomplete. Taking dulcolax laxative maybe once every couple of weeks.   Levothyroxine increased to 200 mcg daily recently from 175 mcg daily due to TSH being elevated in August. Used to be hyperthyroid and reports having radioactive iodine ablation about 40 years ago.    Takes baby aspirin and naproxen routinely.  Naproxen is for leg and feet pain.    Past Medical History:  Diagnosis Date  . Arthritis   . Atherosclerotic heart disease of native coronary artery with unspecified angina pectoris ([place])   . CAD (coronary artery disease)    a. s/p CABG in 2000  . Essential (primary) hypertension   . GERD (gastroesophageal reflux disease)   . Heart disease   . Hx of adenomatous colonic polyps   . Hypercholesterolemia   . Hyperlipidemia, unspecified   . Hypertension   . Hypothyroidism   . Hypothyroidism, unspecified   . Menopausal and female climacteric states   . Menopause   . Nicotine dependence, unspecified, uncomplicated   . Other obesity   . Pain in right foot   . Sleep apnea    pt doesn't wear her CPAP machine-PCP knows patient unable to wear CPAP  . Sleep apnea, unspecified   . Venous insufficiency (chronic) (peripheral)     Past Surgical History:  Procedure Laterality Date  . ABDOMINAL HYSTERECTOMY    . BONE EXOSTOSIS EXCISION  04/25/2012   Procedure: EXOSTOSIS EXCISION;  Surgeon: Marcheta Grammes, DPM;  Location: [facility];  Service: Orthopedics;  Laterality: Left;  Retrocalcaneal Exostectomy Left Foot  . CATARACT EXTRACTION W/PHACO Right 09/08/2013   Procedure: CATARACT EXTRACTION PHACO AND INTRAOCULAR LENS PLACEMENT (IOC);  Surgeon: Tonny Branch, MD;  Location: [facility];  Service: Ophthalmology;  Laterality: Right;  CDE 9.13  . CHOLECYSTECTOMY    .  COLONOSCOPY  04/16/07   diminutive rectal polyp removed, scattered diverticula, path: tubular adenoma  . COLONOSCOPY  04/14/02   external hemorrhoids otherwise normal  . COLONOSCOPY  05/24/2011   Dr. Rourk:tubular adenoma and hyperplastic polyp, long redundant colon/left sided diverticulosis/internal hemorrhoids, incomplete examination, unable to reach the cecum despite external abdominal pressure, changing position. Barium enema incomplete.   . COLONOSCOPY N/A 02/05/2015   Procedure:  COLONOSCOPY;  Surgeon: Daneil Dolin, MD;  elongated colon, redundant colon-incomplete with inability to reach to deeply intubate the cecum despite external pressure and changing the patients position.   . CORONARY ARTERY BYPASS GRAFT  2000   triple bypass  . S/P Hysterectomy      Current Outpatient Medications  Medication Sig Dispense Refill  . aspirin 81 MG tablet Take 81 mg by mouth daily.      . chlorthalidone (HYGROTON) 25 MG tablet TAKE 1 TABLET BY MOUTH EVERY DAY 90 tablet 3  . furosemide (LASIX) 40 MG tablet Take 40 mg by mouth daily as needed.  5  . KLOR-CON M20 20 MEQ tablet TAKE 2 TABLETS BY MOUTH DAILY (Patient taking differently: Take 20 mEq by mouth daily. ) 180 tablet 3  . levothyroxine (SYNTHROID) 200 MCG tablet Take 200 mcg by mouth daily.    Marland Kitchen losartan (COZAAR) 100 MG tablet Take 100 mg by mouth daily.     . Magnesium Oxide -Mg Supplement 400 MG CAPS TAKE 1 CAPSULE BY MOUTH DAILY. 90 capsule 3  . naproxen (NAPROSYN) 500 MG tablet Take 500 mg by mouth daily.    Marland Kitchen omeprazole (PRILOSEC) 40 MG capsule Take 40 mg by mouth daily.    . rosuvastatin (CRESTOR) 40 MG tablet Take 40 mg by mouth daily.    Marland Kitchen spironolactone (ALDACTONE) 25 MG tablet TAKE 1 TABLET BY MOUTH EVERY DAY 90 tablet 3  . verapamil (VERELAN PM) 360 MG 24 hr capsule Take 360 mg by mouth at bedtime.     No current facility-administered medications for this visit.    Allergies as of 03/15/2020  . (No Known Allergies)    Family History  Problem Relation Age of Onset  . Colon cancer Mother        diagnosed at age 57, passed away at age 94 from metastasis  . Heart attack Father   . Prostate cancer Brother   . COPD Brother     Social History   Socioeconomic History  . Marital status: Single    Spouse name: Not on file  . Number of children: 0  . Years of education: Not on file  . Highest education level: Not on file  Occupational History  . Occupation: full-time    Employer: [employer]   Tobacco Use  . Smoking status: Current Every Day Smoker    Packs/day: 0.50    Years: 15.00    Pack years: 7.50    Types: Cigarettes  . Smokeless tobacco: Never Used  Vaping Use  . Vaping Use: Never used  Substance and Sexual Activity  . Alcohol use: No  . Drug use: No  . Sexual activity: Yes    Birth control/protection: Surgical  Other Topics Concern  . Not on file  Social History Narrative  . Not on file   Social Determinants of Health   Financial Resource Strain:   . Difficulty of Paying Living Expenses: Not on file  Food Insecurity:   . Worried About Charity fundraiser in the Last Year: Not on file  .  Ran Out of Food in the Last Year: Not on file  Transportation Needs:   . Lack of Transportation (Medical): Not on file  . Lack of Transportation (Non-Medical): Not on file  Physical Activity:   . Days of Exercise per Week: Not on file  . Minutes of Exercise per Session: Not on file  Stress:   . Feeling of Stress : Not on file  Social Connections:   . Frequency of Communication with Friends and Family: Not on file  . Frequency of Social Gatherings with Friends and Family: Not on file  . Attends Religious Services: Not on file  . Active Member of Clubs or Organizations: Not on file  . Attends Archivist Meetings: Not on file  . Marital Status: Not on file  Intimate Partner Violence:   . Fear of Current or Ex-Partner: Not on file  . Emotionally Abused: Not on file  . Physically Abused: Not on file  . Sexually Abused: Not on file    Review of Systems: Gen: Denies any fever, chills, cold or flulike symptoms, lightheadedness, dizziness, presyncope, syncope. CV: Denies chest pain or heart palpitations. Resp: Denies shortness of breath or cough. GI: See HPI MS: See HPI Heme: See HPI  Physical Exam: BP (!) 158/83   Pulse 90   Temp (!) 96.9 F (36.1 C) (Temporal)   Ht '5\' 3"'  (1.6 m)   Wt 216 lb 3.2 oz (98.1 kg)   BMI 38.30 kg/m  General:   Alert and  oriented. Pleasant and cooperative. Well-nourished and well-developed.  Head:  Normocephalic and atraumatic. Eyes:  Without icterus, sclera clear and conjunctiva pink.  Ears:  Normal auditory acuity. Lungs:  Clear to auscultation bilaterally. No wheezes, rales, or rhonchi. No distress.  Heart:  S1, S2 present without murmurs appreciated.  Abdomen:  +BS, soft, non-tender and non-distended. No HSM noted. No guarding or rebound. No masses appreciated.  Rectal:  Deferred  Msk:  Symmetrical without gross deformities. Normal posture. Extremities:  Without edema. Neurologic:  Alert and  oriented x4;  grossly normal neurologically. Skin:  Intact without significant lesions or rashes. Psych: Normal mood and affect.  Labs: 12/31/2019 CBC: WBC 12.8 (H), hemoglobin 11.7, hematocrit 36.6, MCV 95, MCH 30.2, MCHC 32.0, platelets 166 CMP: Glucose 89, creatinine 1.30, sodium 143, potassium 4.6, calcium 10.1, albumin 4.1, total bilirubin 0.3, alk phos 90, AST 15, ALT 11 Lipid panel: Total cholesterol 140, triglycerides 122, HDL 39, LDL 22 Hemoglobin A1c 6.1 TSH 7.5 Free T4 1.38 Magnesium 1.7

## 2020-03-15 ENCOUNTER — Ambulatory Visit: Payer: Medicare Other | Admitting: Gastroenterology

## 2020-03-15 ENCOUNTER — Other Ambulatory Visit: Payer: Self-pay

## 2020-03-15 ENCOUNTER — Encounter: Payer: Self-pay | Admitting: Gastroenterology

## 2020-03-15 VITALS — BP 158/83 | HR 90 | Temp 96.9°F | Ht 63.0 in | Wt 216.2 lb

## 2020-03-15 DIAGNOSIS — Z860100 Personal history of colon polyps, unspecified: Secondary | ICD-10-CM

## 2020-03-15 DIAGNOSIS — R6881 Early satiety: Secondary | ICD-10-CM

## 2020-03-15 DIAGNOSIS — K5900 Constipation, unspecified: Secondary | ICD-10-CM | POA: Diagnosis not present

## 2020-03-15 DIAGNOSIS — R634 Abnormal weight loss: Secondary | ICD-10-CM | POA: Diagnosis not present

## 2020-03-15 DIAGNOSIS — K219 Gastro-esophageal reflux disease without esophagitis: Secondary | ICD-10-CM

## 2020-03-15 NOTE — Patient Instructions (Signed)
We will be scheduled for an upper endoscopy and colonoscopy in the near future with Dr. Gala Romney.  For constipation: Please start MiraLAX 1 capful (17 g) daily in 8 ounces of water. You may also start Metamucil daily.  This is a fiber supplement. Be sure you are drinking enough water to keep your urine pale yellow to clear. Eat plenty of fruits and vegetables to maintain adequate fiber intake.  Please call with a progress report in 2 weeks.  We need to ensure that constipation is well managed prior to your colonoscopy to ensure that you have a good colon prep.  We will plan to see back after your procedures.  Do not hesitate to call if you have questions or concerns prior.  It was great meeting you today!  Aliene Altes, PA-C [facility]

## 2020-03-16 ENCOUNTER — Telehealth: Payer: Self-pay

## 2020-03-16 NOTE — Telephone Encounter (Signed)
Rosendo Gros gave ok for Dr. Abbey Chatters to do TCS/EGD w/Propofol ASA 3 d/t nothing available soon for Dr. Gala Romney. Needs procedure soon d/t weight loss per Aliene Altes PA.  Called pt, she is ok with Dr. Abbey Chatters doing procedure. TCS/EGD scheduled for 04/27/20 at 2:15pm. Orders entered. Comment placed in procedure order, extra time needed per Dr. Gala Romney d/t difficult/incomplete TCS previously.  FYI to [employer], [place].

## 2020-03-16 NOTE — Telephone Encounter (Signed)
Noted  

## 2020-03-17 NOTE — Telephone Encounter (Signed)
Pre-op/covid test 04/23/20, appt letter mailed with procedure instructions.

## 2020-03-18 ENCOUNTER — Encounter: Payer: Self-pay | Admitting: Gastroenterology

## 2020-03-18 DIAGNOSIS — R6881 Early satiety: Secondary | ICD-10-CM | POA: Insufficient documentation

## 2020-03-18 DIAGNOSIS — K219 Gastro-esophageal reflux disease without esophagitis: Secondary | ICD-10-CM | POA: Insufficient documentation

## 2020-03-18 DIAGNOSIS — R634 Abnormal weight loss: Secondary | ICD-10-CM | POA: Insufficient documentation

## 2020-03-18 DIAGNOSIS — K5900 Constipation, unspecified: Secondary | ICD-10-CM | POA: Insufficient documentation

## 2020-03-18 NOTE — Assessment & Plan Note (Signed)
Chronic.  Symptoms currently well controlled on omeprazole 40 mg daily.  She is reporting early satiety and lack of appetite which began following a GERD flare.  Also with unintentional weight loss which is discussed below.  We are planning for EGD.  As her typical GERD symptoms are well controlled currently, plan to continue omeprazole 40 mg daily for now.  Follow-up after procedures.

## 2020-03-18 NOTE — Assessment & Plan Note (Signed)
Addressed under loss of weight 

## 2020-03-18 NOTE — Assessment & Plan Note (Addendum)
64 year old female reporting change in bowel habits, now with constipation for the last couple of years. This could be secondary to hypothyroidism, diet, or idiopathic in nature.  However, she does have history of adenomatous colon polyps, family history of mother with colon cancer in her 20s, and is reporting unintentional weight loss.  She has lost about 47 pounds over the last 13 months.  Weight loss may be influenced by upper GI symptoms including early satiety and lack of appetite; however, colonic malignancy remains in the differential.  Denies abdominal pain, BRBPR, or melena.  She is due for surveillance colonoscopy.  Her last colonoscopy was in 2016 with elongated colon, redundant colon, incomplete exam with inability to reach to deeply intubate the cecum.  I discussed this today with Dr. Gala Romney; he recommends reattempting colonoscopy with additional time scheduled for procedure.   Plan: Start MiraLAX 1 capful (17 g) daily in 8 ounces of water. Also start Metamucil daily.  Drink enough water to keep urine pale yellow to clear. Eat plenty of fruits and vegetables to maintain adequate fiber intake. Proceed with colonoscopy with propofol Dr. Gala Romney in the near future. The risks, benefits, and alternatives have been discussed with the patient in detail. The patient states understanding and desires to proceed.  ASA  III Propofol due to increased sedation requirements during last colonoscopy. Requested patient's procedure to be scheduled with additional time due to prior procedure difficulties. Requested she call the progress report in 2 weeks regarding constipation to ensure her bowels are moving well prior to colonoscopy. Plan to follow-up after procedure.

## 2020-03-18 NOTE — Assessment & Plan Note (Addendum)
64 year old female with history of adenomatous colon polyps who is due for surveillance colonoscopy.  Family history also significant for mother with colon cancer in her 35s.  Patient's last colonoscopy was in September 2016 with elongated colon, redundant colon, colonoscopy incomplete with inability to reach to deeply intubate the cecum despite external pressure and changing patient's position.  I discussed this today with Dr. Gala Romney who recommended reattempting colonoscopy.  Requested extra time be allotted for her procedure.  Notably, over the last couple of years, she reports change in bowel habits and is struggling with constipation.  This could be secondary to hypothyroidism.  However, she also is reporting unintentional weight loss which could be secondary to upper GI etiology discussed below under weight loss, but cannot rule out colonic malignancy.  Plan: Proceed with colonoscopy with propofol Dr. Gala Romney in the near future. The risks, benefits, and alternatives have been discussed with the patient in detail. The patient states understanding and desires to proceed.  ASA III Propofol due to increased sedation requirements during last colonoscopy. Patient will have extra time allotted for procedure. Constipation addressed below.  Requested progress report in 2 weeks to ensure bowels are moving well prior to colonoscopy. Follow-up after procedure.

## 2020-03-18 NOTE — Assessment & Plan Note (Addendum)
64 year old female with GI history significant for GERD, adenomatous colon polyps, and mother with colon cancer in her 30s presenting today for further evaluation of unintentional weight loss, early satiety, and lack of appetite.  Per chart review, patient has lost 47 pounds over the last 13 months.  She feels most of this weight loss has been more recently following an episode of significant acid reflux where she became afraid to eat much of anything.  Protonix was switched to omeprazole and GERD is now well controlled, but she continues with lack of appetite and early satiety.  Also with change in bowel habits over the last couple of years, now with constipation.  No BRBPR, melena, abdominal pain, nausea, vomiting, or dysphagia.  Taking baby aspirin and naproxen routinely.  Differentials for early satiety/like her life is high include gastritis, duodenitis, esophagitis, H. pylori, gastroparesis, pyloric stenosis, other gastric outlet obstruction including malignancy.  Change in bowel habits may be influenced by hypothyroidism.  With weight loss and family history of colon cancer, malignancy remains on the differential.  She is currently due for surveillance colonoscopy as her last colonoscopy was in 2016.  Notably, Dr. Gala Romney could not reach to deeply intubate the cecum.  I discussed this with him today and he recommended attempting colonoscopy again with extra allotted time.   Plan: Continue omeprazole 40 mg daily for now. Proceed with EGD + TCS with propofol with Dr. Gala Romney in the near future. The risks, benefits, and alternatives have been discussed with the patient in detail. The patient states understanding and desires to proceed.  Propofol due to increased sedation requirements during last colonoscopy. ASA III Patient will have extra time scheduled for procedure due to prior difficulties with reaching the cecum on colonoscopy. Constipation as addressed above.  I have requested patient to call in 2  weeks with a progress report on constipation to ensure her bowels are moving well prior to her colonoscopy. Follow-up after procedures.

## 2020-03-19 NOTE — Patient Instructions (Signed)
PA for EGD submitted via [facility] website. PA# [id number], valid 04/27/20-05/14/20. No PA need for TCS [facility] Medicare).

## 2020-03-25 NOTE — Progress Notes (Signed)
Cc'ed to pcp °

## 2020-04-12 DIAGNOSIS — Z23 Encounter for immunization: Secondary | ICD-10-CM | POA: Diagnosis not present

## 2020-04-12 DIAGNOSIS — I10 Essential (primary) hypertension: Secondary | ICD-10-CM | POA: Diagnosis not present

## 2020-04-12 DIAGNOSIS — E039 Hypothyroidism, unspecified: Secondary | ICD-10-CM | POA: Diagnosis not present

## 2020-04-12 DIAGNOSIS — E1165 Type 2 diabetes mellitus with hyperglycemia: Secondary | ICD-10-CM | POA: Diagnosis not present

## 2020-04-15 DIAGNOSIS — K219 Gastro-esophageal reflux disease without esophagitis: Secondary | ICD-10-CM | POA: Diagnosis not present

## 2020-04-15 DIAGNOSIS — E039 Hypothyroidism, unspecified: Secondary | ICD-10-CM | POA: Diagnosis not present

## 2020-04-15 DIAGNOSIS — Z23 Encounter for immunization: Secondary | ICD-10-CM | POA: Diagnosis not present

## 2020-04-15 DIAGNOSIS — I10 Essential (primary) hypertension: Secondary | ICD-10-CM | POA: Diagnosis not present

## 2020-04-15 DIAGNOSIS — E785 Hyperlipidemia, unspecified: Secondary | ICD-10-CM | POA: Diagnosis not present

## 2020-04-15 DIAGNOSIS — E876 Hypokalemia: Secondary | ICD-10-CM | POA: Diagnosis not present

## 2020-04-20 NOTE — Patient Instructions (Signed)
Your procedure is scheduled on: 04/27/2020  Report to [facility] at     12:30 PM.  Call this number if you have problems the morning of surgery: [phone]   Remember:              Follow Directions on the letter you received from Your Physician's office regarding the Bowel Prep              No Smoking the day of Procedure :   Take these medicines the morning of surgery with A SIP OF WATER: Levothyroxine, Omeprazole and Aldactone   Do not wear jewelry, make-up or nail polish.    Do not bring valuables to the hospital.  Contacts, dentures or bridgework may not be worn into surgery.  .   Patients discharged the day of surgery will not be allowed to drive home.     Colonoscopy, Adult, Care After This sheet gives you information about how to care for yourself after your procedure. Your health care provider may also give you more specific instructions. If you have problems or questions, contact your health care provider. What can I expect after the procedure? After the procedure, it is common to have:  A small amount of blood in your stool for 24 hours after the procedure.  Some gas.  Mild abdominal cramping or bloating.  Follow these instructions at home: General instructions   For the first 24 hours after the procedure: ? Do not drive or use machinery. ? Do not sign important documents. ? Do not drink alcohol. ? Do your regular daily activities at a slower pace than normal. ? Eat soft, easy-to-digest foods. ? Rest often.  Take over-the-counter or prescription medicines only as told by your health care provider.  It is up to you to get the results of your procedure. Ask your health care provider, or the department performing the procedure, when your results will be ready. Relieving cramping and bloating  Try walking around when you have cramps or feel bloated.  Apply heat to your abdomen as told by your health care provider. Use a heat source that your health care  provider recommends, such as a moist heat pack or a heating pad. ? Place a towel between your skin and the heat source. ? Leave the heat on for 20-30 minutes. ? Remove the heat if your skin turns bright red. This is especially important if you are unable to feel pain, heat, or cold. You may have a greater risk of getting burned. Eating and drinking  Drink enough fluid to keep your urine clear or pale yellow.  Resume your normal diet as instructed by your health care provider. Avoid heavy or fried foods that are hard to digest.  Avoid drinking alcohol for as long as instructed by your health care provider. Contact a health care provider if:  You have blood in your stool 2-3 days after the procedure. Get help right away if:  You have more than a small spotting of blood in your stool.  You pass large blood clots in your stool.  Your abdomen is swollen.  You have nausea or vomiting.  You have a fever.  You have increasing abdominal pain that is not relieved with medicine. This information is not intended to replace advice given to you by your health care provider. Make sure you discuss any questions you have with your health care provider. Document Released: 12/14/2003 Document Revised: 01/24/2016 Document Reviewed: 07/13/2015 Elsevier Interactive Patient Education  2018 [place].  Upper Endoscopy, Adult, Care After This sheet gives you information about how to care for yourself after your procedure. Your health care provider may also give you more specific instructions. If you have problems or questions, contact your health care provider. What can I expect after the procedure? After the procedure, it is common to have:  A sore throat.  Mild stomach pain or discomfort.  Bloating.  Nausea. Follow these instructions at home:   Follow instructions from your health care provider about what to eat or drink after your procedure.  Return to your normal activities as told by  your health care provider. Ask your health care provider what activities are safe for you.  Take over-the-counter and prescription medicines only as told by your health care provider.  Do not drive for 24 hours if you were given a sedative during your procedure.  Keep all follow-up visits as told by your health care provider. This is important. Contact a health care provider if you have:  A sore throat that lasts longer than one day.  Trouble swallowing. Get help right away if:  You vomit blood or your vomit looks like coffee grounds.  You have: ? A fever. ? Bloody, black, or tarry stools. ? A severe sore throat or you cannot swallow. ? Difficulty breathing. ? Severe pain in your chest or abdomen. Summary  After the procedure, it is common to have a sore throat, mild stomach discomfort, bloating, and nausea.  Do not drive for 24 hours if you were given a sedative during the procedure.  Follow instructions from your health care provider about what to eat or drink after your procedure.  Return to your normal activities as told by your health care provider. This information is not intended to replace advice given to you by your health care provider. Make sure you discuss any questions you have with your health care provider. Document Revised: 10/23/2017 Document Reviewed: 10/01/2017 Elsevier Patient Education  [place].

## 2020-04-23 ENCOUNTER — Other Ambulatory Visit (HOSPITAL_COMMUNITY)
Admission: RE | Admit: 2020-04-23 | Discharge: 2020-04-23 | Disposition: A | Discharge status: Home / Self Care | Payer: Medicare Other | Source: Ambulatory Visit | Attending: Internal Medicine | Admitting: Internal Medicine

## 2020-04-23 ENCOUNTER — Other Ambulatory Visit: Payer: Self-pay

## 2020-04-23 ENCOUNTER — Encounter (HOSPITAL_COMMUNITY): Payer: Self-pay

## 2020-04-23 ENCOUNTER — Encounter (HOSPITAL_COMMUNITY)
Admission: RE | Admit: 2020-04-23 | Discharge: 2020-04-23 | Disposition: A | Discharge status: Home / Self Care | Payer: Medicare Other | Source: Ambulatory Visit | Attending: Internal Medicine | Admitting: Internal Medicine

## 2020-04-23 DIAGNOSIS — Z20822 Contact with and (suspected) exposure to covid-19: Secondary | ICD-10-CM | POA: Insufficient documentation

## 2020-04-23 DIAGNOSIS — Z01818 Encounter for other preprocedural examination: Secondary | ICD-10-CM | POA: Diagnosis not present

## 2020-04-23 DIAGNOSIS — I10 Essential (primary) hypertension: Secondary | ICD-10-CM | POA: Insufficient documentation

## 2020-04-23 LAB — BASIC METABOLIC PANEL
Anion gap: 11 (ref 5–15)
BUN: 19 mg/dL (ref 8–23)
CO2: 26 mmol/L (ref 22–32)
Calcium: 10 mg/dL (ref 8.9–10.3)
Chloride: 101 mmol/L (ref 98–111)
Creatinine, Ser: 1.18 mg/dL — ABNORMAL HIGH (ref 0.44–1.00)
GFR, Estimated: 51 mL/min — ABNORMAL LOW (ref >60)
Glucose, Bld: 91 mg/dL (ref 70–99)
Potassium: 3.8 mmol/L (ref 3.5–5.1)
Sodium: 138 mmol/L (ref 135–145)

## 2020-04-23 LAB — SARS CORONAVIRUS 2 (TAT 6-24 HRS): SARS Coronavirus 2: NEGATIVE

## 2020-04-27 ENCOUNTER — Ambulatory Visit (HOSPITAL_COMMUNITY): Payer: Medicare Other | Admitting: Certified Registered"

## 2020-04-27 ENCOUNTER — Encounter (HOSPITAL_COMMUNITY)
Admission: RE | Disposition: A | Discharge status: Home / Self Care | Payer: Self-pay | Source: Home / Self Care | Attending: Internal Medicine

## 2020-04-27 ENCOUNTER — Ambulatory Visit (HOSPITAL_COMMUNITY)
Admission: RE | Admit: 2020-04-27 | Discharge: 2020-04-27 | Disposition: A | Discharge status: Home / Self Care | Payer: Medicare Other | Attending: Internal Medicine | Admitting: Internal Medicine

## 2020-04-27 ENCOUNTER — Other Ambulatory Visit: Payer: Self-pay

## 2020-04-27 ENCOUNTER — Encounter (HOSPITAL_COMMUNITY): Payer: Self-pay

## 2020-04-27 DIAGNOSIS — D123 Benign neoplasm of transverse colon: Secondary | ICD-10-CM | POA: Diagnosis not present

## 2020-04-27 DIAGNOSIS — Z825 Family history of asthma and other chronic lower respiratory diseases: Secondary | ICD-10-CM | POA: Insufficient documentation

## 2020-04-27 DIAGNOSIS — Z8249 Family history of ischemic heart disease and other diseases of the circulatory system: Secondary | ICD-10-CM | POA: Insufficient documentation

## 2020-04-27 DIAGNOSIS — K635 Polyp of colon: Secondary | ICD-10-CM

## 2020-04-27 DIAGNOSIS — I10 Essential (primary) hypertension: Secondary | ICD-10-CM | POA: Insufficient documentation

## 2020-04-27 DIAGNOSIS — Z951 Presence of aortocoronary bypass graft: Secondary | ICD-10-CM | POA: Insufficient documentation

## 2020-04-27 DIAGNOSIS — I2510 Atherosclerotic heart disease of native coronary artery without angina pectoris: Secondary | ICD-10-CM | POA: Insufficient documentation

## 2020-04-27 DIAGNOSIS — Z79899 Other long term (current) drug therapy: Secondary | ICD-10-CM | POA: Diagnosis not present

## 2020-04-27 DIAGNOSIS — K648 Other hemorrhoids: Secondary | ICD-10-CM | POA: Diagnosis not present

## 2020-04-27 DIAGNOSIS — F17210 Nicotine dependence, cigarettes, uncomplicated: Secondary | ICD-10-CM | POA: Insufficient documentation

## 2020-04-27 DIAGNOSIS — R12 Heartburn: Secondary | ICD-10-CM | POA: Diagnosis not present

## 2020-04-27 DIAGNOSIS — Z8042 Family history of malignant neoplasm of prostate: Secondary | ICD-10-CM | POA: Insufficient documentation

## 2020-04-27 DIAGNOSIS — Z7982 Long term (current) use of aspirin: Secondary | ICD-10-CM | POA: Diagnosis not present

## 2020-04-27 DIAGNOSIS — Z86010 Personal history of colonic polyps: Secondary | ICD-10-CM | POA: Insufficient documentation

## 2020-04-27 DIAGNOSIS — K5730 Diverticulosis of large intestine without perforation or abscess without bleeding: Secondary | ICD-10-CM | POA: Diagnosis not present

## 2020-04-27 DIAGNOSIS — K2970 Gastritis, unspecified, without bleeding: Secondary | ICD-10-CM | POA: Insufficient documentation

## 2020-04-27 DIAGNOSIS — Z1211 Encounter for screening for malignant neoplasm of colon: Secondary | ICD-10-CM | POA: Insufficient documentation

## 2020-04-27 DIAGNOSIS — R634 Abnormal weight loss: Secondary | ICD-10-CM | POA: Diagnosis not present

## 2020-04-27 DIAGNOSIS — I872 Venous insufficiency (chronic) (peripheral): Secondary | ICD-10-CM | POA: Diagnosis not present

## 2020-04-27 DIAGNOSIS — R6881 Early satiety: Secondary | ICD-10-CM | POA: Diagnosis not present

## 2020-04-27 DIAGNOSIS — K3189 Other diseases of stomach and duodenum: Secondary | ICD-10-CM | POA: Diagnosis not present

## 2020-04-27 DIAGNOSIS — Z800 Family history of malignant neoplasm of digestive organs: Secondary | ICD-10-CM | POA: Diagnosis not present

## 2020-04-27 HISTORY — PX: ESOPHAGOGASTRODUODENOSCOPY (EGD) WITH PROPOFOL: SHX5813

## 2020-04-27 HISTORY — PX: COLONOSCOPY WITH PROPOFOL: SHX5780

## 2020-04-27 HISTORY — PX: BIOPSY: SHX5522

## 2020-04-27 SURGERY — COLONOSCOPY WITH PROPOFOL
Anesthesia: General

## 2020-04-27 MED ORDER — LIDOCAINE HCL (CARDIAC) PF 100 MG/5ML IV SOSY
PREFILLED_SYRINGE | INTRAVENOUS | Status: DC | PRN
  Administered 2020-04-27: 100 mg via INTRAVENOUS

## 2020-04-27 MED ORDER — STERILE WATER FOR IRRIGATION IR SOLN
Status: DC | PRN
  Administered 2020-04-27: 200 mL

## 2020-04-27 MED ORDER — LACTATED RINGERS IV SOLN: INTRAVENOUS | Status: DC | PRN

## 2020-04-27 MED ORDER — PROPOFOL 10 MG/ML IV BOLUS
INTRAVENOUS | Status: DC | PRN
  Administered 2020-04-27: 150 ug/kg/min via INTRAVENOUS
  Administered 2020-04-27: 100 mg via INTRAVENOUS

## 2020-04-27 NOTE — Discharge Instructions (Addendum)
EGD Discharge instructions Please read the instructions outlined below and refer to this sheet in the next few weeks. These discharge instructions provide you with general information on caring for yourself after you leave the hospital. Your doctor may also give you specific instructions. While your treatment has been planned according to the most current medical practices available, unavoidable complications occasionally occur. If you have any problems or questions after discharge, please call your doctor. ACTIVITY  You may resume your regular activity but move at a slower pace for the next 24 hours.   Take frequent rest periods for the next 24 hours.   Walking will help expel (get rid of) the air and reduce the bloated feeling in your abdomen.   No driving for 24 hours (because of the anesthesia (medicine) used during the test).   You may shower.   Do not sign any important legal documents or operate any machinery for 24 hours (because of the anesthesia used during the test).  NUTRITION  Drink plenty of fluids.   You may resume your normal diet.   Begin with a light meal and progress to your normal diet.   Avoid alcoholic beverages for 24 hours or as instructed by your caregiver.  MEDICATIONS  You may resume your normal medications unless your caregiver tells you otherwise.  WHAT YOU CAN EXPECT TODAY  You may experience abdominal discomfort such as a feeling of fullness or "gas" pains.  FOLLOW-UP  Your doctor will discuss the results of your test with you.  SEEK IMMEDIATE MEDICAL ATTENTION IF ANY OF THE FOLLOWING OCCUR:  Excessive nausea (feeling sick to your stomach) and/or vomiting.   Severe abdominal pain and distention (swelling).   Trouble swallowing.   Temperature over 101 F (37.8 C).   Rectal bleeding or vomiting of blood.     Colonoscopy Discharge Instructions  Read the instructions outlined below and refer to this sheet in the next few weeks. These  discharge instructions provide you with general information on caring for yourself after you leave the hospital. Your doctor may also give you specific instructions. While your treatment has been planned according to the most current medical practices available, unavoidable complications occasionally occur.   ACTIVITY  You may resume your regular activity, but move at a slower pace for the next 24 hours.   Take frequent rest periods for the next 24 hours.   Walking will help get rid of the air and reduce the bloated feeling in your belly (abdomen).   No driving for 24 hours (because of the medicine (anesthesia) used during the test).    Do not sign any important legal documents or operate any machinery for 24 hours (because of the anesthesia used during the test).  NUTRITION  Drink plenty of fluids.   You may resume your normal diet as instructed by your doctor.   Begin with a light meal and progress to your normal diet. Heavy or fried foods are harder to digest and may make you feel sick to your stomach (nauseated).   Avoid alcoholic beverages for 24 hours or as instructed.  MEDICATIONS  You may resume your normal medications unless your doctor tells you otherwise.  WHAT YOU CAN EXPECT TODAY  Some feelings of bloating in the abdomen.   Passage of more gas than usual.   Spotting of blood in your stool or on the toilet paper.  IF YOU HAD POLYPS REMOVED DURING THE COLONOSCOPY:  No aspirin products for 7 days or as instructed.  No alcohol for 7 days or as instructed.   Eat a soft diet for the next 24 hours.  FINDING OUT THE RESULTS OF YOUR TEST Not all test results are available during your visit. If your test results are not back during the visit, make an appointment with your caregiver to find out the results. Do not assume everything is normal if you have not heard from your caregiver or the medical facility. It is important for you to follow up on all of your test results.   SEEK IMMEDIATE MEDICAL ATTENTION IF:  You have more than a spotting of blood in your stool.   Your belly is swollen (abdominal distention).   You are nauseated or vomiting.   You have a temperature over 101.   You have abdominal pain or discomfort that is severe or gets worse throughout the day.   Your EGD showed a moderate amount of inflammation in your stomach.  I biopsied this to rule out infection with a bacteria called H. pylori.  Continue on omeprazole daily 30 minutes before breakfast.  Also took biopsies of your small bowel to rule out celiac disease.  Await pathology results, my office will contact you.  Your colonoscopy was relatively unremarkable.  It was tortuous that we were able to reach the cecum.  You had 1 small polyp which I removed successfully.  The bowel preparation was not ideal.  I would recommend we repeat this in 3 years for surveillance purposes.  Follow-up with GI in 3 months or sooner if needed.                                                                                                     July 27, 2020 at 2:30 pm. I hope you have a great rest of your week!  Elon Alas. Abbey Chatters, D.O. Gastroenterology and Hepatology [facility]        Monitored Anesthesia Care, Care After These instructions provide you with information about caring for yourself after your procedure. Your health care provider may also give you more specific instructions. Your treatment has been planned according to current medical practices, but problems sometimes occur. Call your health care provider if you have any problems or questions after your procedure. What can I expect after the procedure? After your procedure, you may:  Feel sleepy for several hours.  Feel clumsy and have poor balance for several hours.  Feel forgetful about what happened after the procedure.  Have poor judgment for several hours.  Feel nauseous or vomit.  Have a sore throat  if you had a breathing tube during the procedure. Follow these instructions at home: For at least 24 hours after the procedure:      Have a responsible adult stay with you. It is important to have someone help care for you until you are awake and alert.  Rest as needed.  Do not: ? Participate in activities in which you could fall or become injured. ? Drive. ? Use heavy machinery. ? Drink alcohol. ? Take sleeping pills or medicines that cause drowsiness. ?  Make important decisions or sign legal documents. ? Take care of children on your own. Eating and drinking  Follow the diet that is recommended by your health care provider.  If you vomit, drink water, juice, or soup when you can drink without vomiting.  Make sure you have little or no nausea before eating solid foods. General instructions  Take over-the-counter and prescription medicines only as told by your health care provider.  If you have sleep apnea, surgery and certain medicines can increase your risk for breathing problems. Follow instructions from your health care provider about wearing your sleep device: ? Anytime you are sleeping, including during daytime naps. ? While taking prescription pain medicines, sleeping medicines, or medicines that make you drowsy.  If you smoke, do not smoke without supervision.  Keep all follow-up visits as told by your health care provider. This is important. Contact a health care provider if:  You keep feeling nauseous or you keep vomiting.  You feel light-headed.  You develop a rash.  You have a fever. Get help right away if:  You have trouble breathing. Summary  For several hours after your procedure, you may feel sleepy and have poor judgment.  Have a responsible adult stay with you for at least 24 hours or until you are awake and alert. This information is not intended to replace advice given to you by your health care provider. Make sure you discuss any questions  you have with your health care provider. Document Revised: 07/30/2017 Document Reviewed: 08/22/2015 Elsevier Patient Education  [place].

## 2020-04-27 NOTE — Anesthesia Postprocedure Evaluation (Signed)
Anesthesia Post Note  Patient: Mariah Stewart  Procedure(s) Performed: COLONOSCOPY WITH PROPOFOL (N/A ) ESOPHAGOGASTRODUODENOSCOPY (EGD) WITH PROPOFOL (N/A ) BIOPSY  Patient location during evaluation: PACU Anesthesia Type: General Level of consciousness: awake, oriented, patient cooperative and awake and alert Pain management: pain level controlled Vital Signs Assessment: post-procedure vital signs reviewed and stable Respiratory status: spontaneous breathing, respiratory function stable and nonlabored ventilation Cardiovascular status: blood pressure returned to baseline and stable Postop Assessment: no headache and no backache Anesthetic complications: no   No complications documented.   Last Vitals:  Vitals:   04/27/20 1215  BP: 117/72  Pulse: 86  Resp: 14  Temp: 37 C  SpO2: 99%    Last Pain:  Vitals:   04/27/20 1239  TempSrc:   PainSc: 0-No pain                 Tacy Learn

## 2020-04-27 NOTE — Anesthesia Preprocedure Evaluation (Signed)
Anesthesia Evaluation  Patient identified by MRN, date of birth, ID band Patient awake    Reviewed: Allergy & Precautions, H&P , NPO status , Patient's Chart, lab work & pertinent test results, reviewed documented beta blocker date and time   Airway Mallampati: II  TM Distance: >3 FB Neck ROM: full    Dental no notable dental hx.    Pulmonary sleep apnea , Current Smoker and Patient abstained from smoking.,    Pulmonary exam normal breath sounds clear to auscultation       Cardiovascular Exercise Tolerance: Good hypertension, + CAD   Rhythm:regular Rate:Normal     Neuro/Psych negative neurological ROS  negative psych ROS   GI/Hepatic Neg liver ROS, GERD  Medicated,  Endo/Other  Hypothyroidism   Renal/GU negative Renal ROS  negative genitourinary   Musculoskeletal   Abdominal   Peds  Hematology negative hematology ROS (+)   Anesthesia Other Findings   Reproductive/Obstetrics negative OB ROS                             Anesthesia Physical Anesthesia Plan  ASA: III  Anesthesia Plan: General   Post-op Pain Management:    Induction:   PONV Risk Score and Plan: Propofol infusion  Airway Management Planned:   Additional Equipment:   Intra-op Plan:   Post-operative Plan:   Informed Consent: I have reviewed the patients History and Physical, chart, labs and discussed the procedure including the risks, benefits and alternatives for the proposed anesthesia with the patient or authorized representative who has indicated his/her understanding and acceptance.     Dental Advisory Given  Plan Discussed with: CRNA  Anesthesia Plan Comments:         Anesthesia Quick Evaluation

## 2020-04-27 NOTE — Op Note (Signed)
[facility] Patient Name: Mariah Stewart Procedure Date: 04/27/2020 12:38 PM MRN: [id number] Date of Birth: November 18, 1955 Attending MD: Elon Alas. Abbey Chatters DO CSN: [id number] Age: 64 Admit Type: Outpatient Procedure:                Upper GI endoscopy Indications:              Heartburn, Weight loss Providers:                Elon Alas. Jehu Mccauslin, DO, Otis Peak B. Sharon Seller, RN,                            Rosina Lowenstein, RN, Wynonia Musty Tech, Technician Referring MD:              Medicines:                See the Anesthesia note for documentation of the                            administered medications Complications:            No immediate complications. Estimated Blood Loss:     Estimated blood loss was minimal. Procedure:                Pre-Anesthesia Assessment:                           - The anesthesia plan was to use monitored                            anesthesia care (MAC).                           After obtaining informed consent, the endoscope was                            passed under direct vision. Throughout the                            procedure, the patient's blood pressure, pulse, and                            oxygen saturations were monitored continuously. The                            [id number]) was introduced through the mouth,                            and advanced to the second part of duodenum. The                            upper GI endoscopy was accomplished without                            difficulty. The patient tolerated the procedure                            well.  Scope In: 12:44:40 PM Scope Out: 12:48:25 PM Total Procedure Duration: 0 hours 3 minutes 45 seconds  Findings:      There is no endoscopic evidence of bleeding, esophagitis, hiatal hernia,       inflammation, ulcerations or varices in the entire esophagus.      Diffuse mild inflammation characterized by erythema was found in the       entire examined stomach. Biopsies were taken  with a cold forceps for       Helicobacter pylori testing.      The duodenal bulb, first portion of the duodenum and second portion of       the duodenum were normal. Biopsies for histology were taken with a cold       forceps for evaluation of celiac disease. Impression:               - Gastritis. Biopsied.                           - Normal duodenal bulb, first portion of the                            duodenum and second portion of the duodenum.                            Biopsied. Moderate Sedation:      Per Anesthesia Care Recommendation:           - Patient has a contact number available for                            emergencies. The signs and symptoms of potential                            delayed complications were discussed with the                            patient. Return to normal activities tomorrow.                            Written discharge instructions were provided to the                            patient.                           - Resume previous diet.                           - Continue present medications.                           - Await pathology results.                           - Return to GI clinic in 3 months. Procedure Code(s):        --- Professional ---  16109, Esophagogastroduodenoscopy, flexible,                            transoral; with biopsy, single or multiple Diagnosis Code(s):        --- Professional ---                           K29.70, Gastritis, unspecified, without bleeding                           R12, Heartburn                           R63.4, Abnormal weight loss CPT copyright 2019 American Medical Association. All rights reserved. The codes documented in this report are preliminary and upon coder review may  be revised to meet current compliance requirements. Elon Alas. Abbey Chatters, DO [place] Abbey Chatters, DO 04/27/2020 12:50:01 PM This report has been signed electronically. Number of Addenda: 0

## 2020-04-27 NOTE — Op Note (Signed)
[facility] Patient Name: Mariah Stewart Procedure Date: 04/27/2020 12:50 PM MRN: [id number] Date of Birth: [birth year] Attending MD: Elon Alas. Edgar Frisk CSN: [id number] Age: 64 Admit Type: Outpatient Procedure:                Colonoscopy Indications:              High risk colon cancer surveillance: Personal                            history of colonic polyps Providers:                Elon Alas. Abbey Chatters, DO, Tammy Vaught, RN, Caprice Kluver Referring MD:              Medicines:                See the Anesthesia note for documentation of the                            administered medications Complications:            No immediate complications. Estimated Blood Loss:     Estimated blood loss was minimal. Procedure:                Pre-Anesthesia Assessment:                           - The anesthesia plan was to use monitored                            anesthesia care (MAC).                           After obtaining informed consent, the colonoscope                            was passed under direct vision. Throughout the                            procedure, the patient's blood pressure, pulse, and                            oxygen saturations were monitored continuously. The                            [id number]-[id number] ([id number]) scope was introduced through                            the anus and advanced to the the cecum, identified                            by appendiceal orifice and ileocecal valve. The                            patient tolerated the procedure well. The quality                            of  the bowel preparation was evaluated using the                            BBPS [facility] Bowel Preparation Scale) with scores                            of: Right Colon = 2 (minor amount of residual                            staining, small fragments of stool and/or opaque                            liquid, but mucosa seen well), Transverse Colon = 2                             (minor amount of residual staining, small fragments                            of stool and/or opaque liquid, but mucosa seen                            well) and Left Colon = 2 (minor amount of residual                            staining, small fragments of stool and/or opaque                            liquid, but mucosa seen well). The total BBPS score                            equals 6. The quality of the bowel preparation was                            fair. The colonoscopy was technically difficult and                            complex due to a redundant colon and significant                            looping. Successful completion of the procedure was                            aided by changing the patient to a supine position                            and using manual pressure. Scope In: 12:52:29 PM Scope Out: 1:14:57 PM Scope Withdrawal Time: 0 hours 11 minutes 13 seconds  Total Procedure Duration: 0 hours 22 minutes 28 seconds  Findings:      The perianal and digital rectal examinations were normal.      Non-bleeding internal hemorrhoids were found during endoscopy.      Multiple small and large-mouthed diverticula were found in the sigmoid  colon and descending colon.      A 2 mm polyp was found in the transverse colon. The polyp was sessile.       The polyp was removed with a cold biopsy forceps. Resection and       retrieval were complete.      A moderate amount of stool was found in the entire colon, making       visualization difficult. Lavage of the area was performed using copious       amounts of sterile water, resulting in clearance with fair visualization. Impression:               - Preparation of the colon was fair.                           - Non-bleeding internal hemorrhoids.                           - Diverticulosis in the sigmoid colon and in the                            descending colon.                           - One 2 mm polyp in the  transverse colon, removed                            with a cold biopsy forceps. Resected and retrieved.                           - Stool in the entire examined colon. Moderate Sedation:      Per Anesthesia Care Recommendation:           - Patient has a contact number available for                            emergencies. The signs and symptoms of potential                            delayed complications were discussed with the                            patient. Return to normal activities tomorrow.                            Written discharge instructions were provided to the                            patient.                           - Resume previous diet.                           - Continue present medications.                           - Await pathology results.                           -  Repeat colonoscopy in 3 years for surveillance                            and due to borderline colon prep.                           - Return to GI clinic in 3 months. Procedure Code(s):        --- Professional ---                           [phone], Colonoscopy, flexible; with biopsy, single                            or multiple Diagnosis Code(s):        --- Professional ---                           Z86.010, Personal history of colonic polyps                           K64.8, Other hemorrhoids                           K63.5, Polyp of colon                           K57.30, Diverticulosis of large intestine without                            perforation or abscess without bleeding CPT copyright 2019 American Medical Association. All rights reserved. The codes documented in this report are preliminary and upon coder review may  be revised to meet current compliance requirements. Elon Alas. Abbey Chatters, DO [place] Abbey Chatters, DO 04/27/2020 1:22:01 PM This report has been signed electronically. Number of Addenda: 0

## 2020-04-27 NOTE — Anesthesia Postprocedure Evaluation (Signed)
Anesthesia Post Note  Patient: Mariah Stewart  Procedure(s) Performed: COLONOSCOPY WITH PROPOFOL (N/A ) ESOPHAGOGASTRODUODENOSCOPY (EGD) WITH PROPOFOL (N/A ) BIOPSY  Patient location during evaluation: Phase II Anesthesia Type: General Level of consciousness: awake Pain management: pain level controlled Vital Signs Assessment: post-procedure vital signs reviewed and stable Respiratory status: spontaneous breathing Cardiovascular status: blood pressure returned to baseline Postop Assessment: no headache Anesthetic complications: no   No complications documented.   Last Vitals:  Vitals:   04/27/20 1215  BP: 117/72  Pulse: 86  Resp: 14  Temp: 37 C  SpO2: 99%    Last Pain:  Vitals:   04/27/20 1239  TempSrc:   PainSc: 0-No pain                 Louann Sjogren

## 2020-04-27 NOTE — Transfer of Care (Signed)
Immediate Anesthesia Transfer of Care Note  Patient: Mariah Stewart  Procedure(s) Performed: COLONOSCOPY WITH PROPOFOL (N/A ) ESOPHAGOGASTRODUODENOSCOPY (EGD) WITH PROPOFOL (N/A ) BIOPSY  Patient Location: PACU  Anesthesia Type:General  Level of Consciousness: awake, alert , oriented and patient cooperative  Airway & Oxygen Therapy: Patient Spontanous Breathing  Post-op Assessment: Report given to RN, Post -op Vital signs reviewed and stable and Patient moving all extremities  Post vital signs: Reviewed and stable  Last Vitals:  Vitals Value Taken Time  BP    Temp    Pulse 83 04/27/20 1317  Resp 23 04/27/20 1317  SpO2 100 % 04/27/20 1317  Vitals shown include unvalidated device data.  Last Pain:  Vitals:   04/27/20 1239  TempSrc:   PainSc: 0-No pain      Patients Stated Pain Goal: 5 (16/10/96 0454)  Complications: No complications documented.

## 2020-04-27 NOTE — H&P (Signed)
Primary Care Physician:  Celene Squibb, MD Primary Gastroenterologist:  Dr. Abbey Chatters  Pre-Procedure History & Physical: HPI:  Mariah Stewart is a 64 y.o. female is here for an EGD due to reflux and early satiety as well as a colonoscopy to be performed for surveillance due to history of adenomatous colon polyps.    Notes mother had colon cancer.  No melena or hematochezia.  Having weight loss.  No change in bowel habits.    Past Medical History:  Diagnosis Date  . Arthritis   . Atherosclerotic heart disease of native coronary artery with unspecified angina pectoris ([place])   . CAD (coronary artery disease)    a. s/p CABG in 2000  . Essential (primary) hypertension   . GERD (gastroesophageal reflux disease)   . Heart disease   . Hx of adenomatous colonic polyps   . Hypercholesterolemia   . Hyperlipidemia, unspecified   . Hypertension   . Hypothyroidism   . Hypothyroidism, unspecified   . Menopausal and female climacteric states   . Menopause   . Nicotine dependence, unspecified, uncomplicated   . Other obesity   . Pain in right foot   . Sleep apnea    pt doesn't wear her CPAP machine-PCP knows patient unable to wear CPAP  . Sleep apnea, unspecified   . Venous insufficiency (chronic) (peripheral)     Past Surgical History:  Procedure Laterality Date  . ABDOMINAL HYSTERECTOMY    . BONE EXOSTOSIS EXCISION  04/25/2012   Procedure: EXOSTOSIS EXCISION;  Surgeon: Marcheta Grammes, DPM;  Location: [facility];  Service: Orthopedics;  Laterality: Left;  Retrocalcaneal Exostectomy Left Foot  . CATARACT EXTRACTION W/PHACO Right 09/08/2013   Procedure: CATARACT EXTRACTION PHACO AND INTRAOCULAR LENS PLACEMENT (IOC);  Surgeon: Tonny Branch, MD;  Location: [facility];  Service: Ophthalmology;  Laterality: Right;  CDE 9.13  . CHOLECYSTECTOMY    . COLONOSCOPY  04/16/07   diminutive rectal polyp removed, scattered diverticula, path: tubular adenoma  . COLONOSCOPY  04/14/02   external hemorrhoids  otherwise normal  . COLONOSCOPY  05/24/2011   Dr. Rourk:tubular adenoma and hyperplastic polyp, long redundant colon/left sided diverticulosis/internal hemorrhoids, incomplete examination, unable to reach the cecum despite external abdominal pressure, changing position. Barium enema incomplete.   . COLONOSCOPY N/A 02/05/2015   Procedure: COLONOSCOPY;  Surgeon: Daneil Dolin, MD;  elongated colon, redundant colon-incomplete with inability to reach to deeply intubate the cecum despite external pressure and changing the patients position.   . CORONARY ARTERY BYPASS GRAFT  2000   triple bypass  . S/P Hysterectomy      Prior to Admission medications   Medication Sig Start Date End Date Taking? Authorizing Provider  aspirin 81 MG tablet Take 81 mg by mouth daily.   Yes [provider]  chlorthalidone (HYGROTON) 25 MG tablet TAKE 1 TABLET BY MOUTH EVERY DAY Patient taking differently: Take 25 mg by mouth daily. 02/06/20  Yes Branch, Alphonse Guild, MD  KLOR-CON M20 20 MEQ tablet TAKE 2 TABLETS BY MOUTH DAILY Patient taking differently: Take 20 mEq by mouth daily. 05/01/19  Yes BranchAlphonse Guild, MD  levothyroxine (SYNTHROID) 200 MCG tablet Take 200 mcg by mouth daily before breakfast.  10/21/19  Yes [provider]  losartan (COZAAR) 100 MG tablet Take 100 mg by mouth daily. 04/14/11  Yes [provider]  Magnesium Oxide -Mg Supplement 400 MG CAPS TAKE 1 CAPSULE BY MOUTH DAILY. Patient taking differently: Take 400 mg by mouth daily. 10/08/18  Yes Branch,  Alphonse Guild, MD  omeprazole (PRILOSEC) 40 MG capsule Take 40 mg by mouth daily. 02/16/20  Yes [provider]  rosuvastatin (CRESTOR) 40 MG tablet Take 40 mg by mouth daily. 12/20/19  Yes [provider]  spironolactone (ALDACTONE) 25 MG tablet TAKE 1 TABLET BY MOUTH EVERY DAY Patient taking differently: Take 25 mg by mouth daily. 05/01/19  Yes BranchAlphonse Guild, MD  verapamil (CALAN-SR) 180 MG CR tablet Take 180 mg  by mouth in the morning and at bedtime. 04/15/20  Yes [provider]  furosemide (LASIX) 40 MG tablet Take 40 mg by mouth daily as needed for fluid or edema.  12/13/16   [provider]    Allergies as of 03/16/2020  . (No Known Allergies)    Family History  Problem Relation Age of Onset  . Colon cancer Mother        diagnosed at age 63, passed away at age 1 from metastasis  . Heart attack Father   . Prostate cancer Brother   . COPD Brother     Social History   Socioeconomic History  . Marital status: Single    Spouse name: Not on file  . Number of children: 0  . Years of education: Not on file  . Highest education level: Not on file  Occupational History  . Occupation: full-time    Employer: [employer]  Tobacco Use  . Smoking status: Current Every Day Smoker    Packs/day: 0.50    Years: 15.00    Pack years: 7.50    Types: Cigarettes  . Smokeless tobacco: Never Used  Vaping Use  . Vaping Use: Never used  Substance and Sexual Activity  . Alcohol use: No  . Drug use: No  . Sexual activity: Yes    Birth control/protection: Surgical  Other Topics Concern  . Not on file  Social History Narrative  . Not on file   Social Determinants of Health   Financial Resource Strain: Not on file  Food Insecurity: Not on file  Transportation Needs: Not on file  Physical Activity: Not on file  Stress: Not on file  Social Connections: Not on file  Intimate Partner Violence: Not on file    Review of Systems: See HPI, otherwise negative ROS  Impression/Plan: Mariah Stewart is here for an EGD due to reflux and early satiety as well as a colonoscopy to be performed for surveillance due to history of adenomatous colon polyps.  The risks of the procedure including infection, bleed, or perforation as well as benefits, limitations, alternatives and imponderables have been reviewed with the patient. Questions have been answered. All parties agreeable.

## 2020-04-29 ENCOUNTER — Other Ambulatory Visit: Payer: Self-pay

## 2020-04-29 LAB — SURGICAL PATHOLOGY

## 2020-05-03 ENCOUNTER — Encounter (HOSPITAL_COMMUNITY): Payer: Self-pay | Admitting: Internal Medicine

## 2020-06-15 DIAGNOSIS — Z23 Encounter for immunization: Secondary | ICD-10-CM | POA: Diagnosis not present

## 2020-07-03 ENCOUNTER — Other Ambulatory Visit: Payer: Self-pay | Admitting: Cardiology

## 2020-07-13 ENCOUNTER — Other Ambulatory Visit: Payer: Self-pay | Admitting: Cardiology

## 2020-07-21 DIAGNOSIS — Z23 Encounter for immunization: Secondary | ICD-10-CM | POA: Diagnosis not present

## 2020-07-21 DIAGNOSIS — I10 Essential (primary) hypertension: Secondary | ICD-10-CM | POA: Diagnosis not present

## 2020-07-21 DIAGNOSIS — E039 Hypothyroidism, unspecified: Secondary | ICD-10-CM | POA: Diagnosis not present

## 2020-07-21 DIAGNOSIS — E785 Hyperlipidemia, unspecified: Secondary | ICD-10-CM | POA: Diagnosis not present

## 2020-07-21 DIAGNOSIS — R7303 Prediabetes: Secondary | ICD-10-CM | POA: Diagnosis not present

## 2020-07-26 DIAGNOSIS — M25561 Pain in right knee: Secondary | ICD-10-CM | POA: Diagnosis not present

## 2020-07-26 DIAGNOSIS — E039 Hypothyroidism, unspecified: Secondary | ICD-10-CM | POA: Diagnosis not present

## 2020-07-26 DIAGNOSIS — I10 Essential (primary) hypertension: Secondary | ICD-10-CM | POA: Diagnosis not present

## 2020-07-26 DIAGNOSIS — I25729 Atherosclerosis of autologous artery coronary artery bypass graft(s) with unspecified angina pectoris: Secondary | ICD-10-CM | POA: Diagnosis not present

## 2020-07-26 DIAGNOSIS — E876 Hypokalemia: Secondary | ICD-10-CM | POA: Diagnosis not present

## 2020-07-26 DIAGNOSIS — N1831 Chronic kidney disease, stage 3a: Secondary | ICD-10-CM | POA: Diagnosis not present

## 2020-07-26 DIAGNOSIS — N6489 Other specified disorders of breast: Secondary | ICD-10-CM | POA: Diagnosis not present

## 2020-07-26 DIAGNOSIS — R7303 Prediabetes: Secondary | ICD-10-CM | POA: Diagnosis not present

## 2020-07-26 DIAGNOSIS — E785 Hyperlipidemia, unspecified: Secondary | ICD-10-CM | POA: Diagnosis not present

## 2020-07-26 DIAGNOSIS — K219 Gastro-esophageal reflux disease without esophagitis: Secondary | ICD-10-CM | POA: Diagnosis not present

## 2020-07-27 ENCOUNTER — Encounter: Payer: Self-pay | Admitting: Gastroenterology

## 2020-07-27 ENCOUNTER — Other Ambulatory Visit: Payer: Self-pay

## 2020-07-27 ENCOUNTER — Other Ambulatory Visit: Payer: Self-pay | Admitting: Cardiology

## 2020-07-27 ENCOUNTER — Ambulatory Visit: Payer: Medicare Other | Admitting: Gastroenterology

## 2020-07-27 VITALS — BP 157/88 | HR 80 | Temp 97.5°F | Ht 63.0 in | Wt 206.2 lb

## 2020-07-27 DIAGNOSIS — R6881 Early satiety: Secondary | ICD-10-CM

## 2020-07-27 DIAGNOSIS — R634 Abnormal weight loss: Secondary | ICD-10-CM

## 2020-07-27 DIAGNOSIS — K219 Gastro-esophageal reflux disease without esophagitis: Secondary | ICD-10-CM

## 2020-07-27 DIAGNOSIS — K5900 Constipation, unspecified: Secondary | ICD-10-CM

## 2020-07-27 NOTE — Progress Notes (Signed)
Referring Provider: Celene Squibb, MD Primary Care Physician:  Celene Squibb, MD  Primary GI: Dr. Gala Romney   Chief Complaint  Patient presents with  . Follow-up    Pp doing fine    HPI:   Mariah Stewart is a 65 y.o. female presenting today with a history of GERD, constipation, recent notable weight loss with weight initially 263 in Sept 2020, 216 in Nov 2021, and now 206 today. History of hyperthyroidism 40 years ago undergoing radioactive iodine treatments and now hypothyroid. TSH just checked 3/9 and blood work will be checked again in 4 weeks. TSH was 12.100, increased from 7 in Nov 2021. Had not been taking Synthroid on empty stomach.   Colonoscopy and EGD recently completed Dec 2021. Surveillance colonoscopy due in 2024 due to borderline prep and history of adenomas.   Returns for blood work in 4 weeks. Appetite has come back. Prior to procedures would go all day long and not be hungry. Food tastes better now. Eating one meal per day. Eats veggie, meal, starch once per day. Cut out soft drinks except for ginger ale. Very little bread. Decreased sugar in coffee. Made those changes when appetite left her. No abdominal pain. No nausea or vomiting. No dysphagia.    LFTs normal. No anemia on CBC. Feels like weight loss has slowed down. She had been taking Synthroid with other meds and now has been instructed to take alone on empty stomach. Miralax once per week for constipation; otherwise, she is doing well with BM daily.  Sept 2020 weight 263.  Nov 2021: 216 Today 206.   Past Medical History:  Diagnosis Date  . Arthritis   . Atherosclerotic heart disease of native coronary artery with unspecified angina pectoris ([place])   . CAD (coronary artery disease)    a. s/p CABG in 2000  . Essential (primary) hypertension   . GERD (gastroesophageal reflux disease)   . Heart disease   . Hx of adenomatous colonic polyps   . Hypercholesterolemia   . Hyperlipidemia, unspecified   .  Hypertension   . Hypothyroidism   . Hypothyroidism, unspecified   . Menopausal and female climacteric states   . Menopause   . Nicotine dependence, unspecified, uncomplicated   . Other obesity   . Pain in right foot   . Sleep apnea    pt doesn't wear her CPAP machine-PCP knows patient unable to wear CPAP  . Sleep apnea, unspecified   . Venous insufficiency (chronic) (peripheral)     Past Surgical History:  Procedure Laterality Date  . ABDOMINAL HYSTERECTOMY    . BIOPSY  04/27/2020   Procedure: BIOPSY;  Surgeon: Eloise Harman, DO;  [facility];  Service: Endoscopy;;  . BONE EXOSTOSIS EXCISION  04/25/2012   Procedure: EXOSTOSIS EXCISION;  Surgeon: Marcheta Grammes, DPM;  Location: [facility];  Service: Orthopedics;  Laterality: Left;  Retrocalcaneal Exostectomy Left Foot  . CATARACT EXTRACTION W/PHACO Right 09/08/2013   Procedure: CATARACT EXTRACTION PHACO AND INTRAOCULAR LENS PLACEMENT (IOC);  Surgeon: Tonny Branch, MD;  Location: [facility];  Service: Ophthalmology;  Laterality: Right;  CDE 9.13  . CHOLECYSTECTOMY    . COLONOSCOPY  04/16/07   diminutive rectal polyp removed, scattered diverticula, path: tubular adenoma  . COLONOSCOPY  04/14/02   external hemorrhoids otherwise normal  . COLONOSCOPY  05/24/2011   Dr. Rourk:tubular adenoma and hyperplastic polyp, long redundant colon/left sided diverticulosis/internal hemorrhoids, incomplete examination, unable to reach the cecum despite external  abdominal pressure, changing position. Barium enema incomplete.   . COLONOSCOPY N/A 02/05/2015   Procedure: COLONOSCOPY;  Surgeon: Daneil Dolin, MD;  elongated colon, redundant colon-incomplete with inability to reach to deeply intubate the cecum despite external pressure and changing the patients position.   . COLONOSCOPY WITH PROPOFOL N/A 04/27/2020   non-bleeding internal hemorrhoids, sigmoid and descending colon diverticulosis, one 2 mm polyp in transverse colon, stool in entire  examined colon. Surveillance 3 years due to borderline prep. Tubular adenoma.   . CORONARY ARTERY BYPASS GRAFT  2000   triple bypass  . ESOPHAGOGASTRODUODENOSCOPY (EGD) WITH PROPOFOL N/A 04/27/2020   gastritis s/p biopsy, normal duodenum. Benign duodenal biopsy. No H.pylori.   . S/P Hysterectomy      Current Outpatient Medications  Medication Sig Dispense Refill  . aspirin 81 MG tablet Take 81 mg by mouth daily.    . chlorthalidone (HYGROTON) 25 MG tablet TAKE 1 TABLET BY MOUTH EVERY DAY (Patient taking differently: Take 25 mg by mouth daily.) 90 tablet 3  . furosemide (LASIX) 40 MG tablet Take 40 mg by mouth daily as needed for fluid or edema.   5  . KLOR-CON M20 20 MEQ tablet TAKE 2 TABLETS BY MOUTH DAILY (Patient taking differently: Take 20 mEq by mouth daily.) 180 tablet 3  . levothyroxine (SYNTHROID) 200 MCG tablet Take 200 mcg by mouth daily before breakfast.     . losartan (COZAAR) 100 MG tablet Take 100 mg by mouth daily.    . Magnesium Oxide -Mg Supplement 400 MG CAPS TAKE 1 CAPSULE BY MOUTH DAILY. (Patient taking differently: Take 400 mg by mouth daily.) 90 capsule 3  . omeprazole (PRILOSEC) 40 MG capsule Take 40 mg by mouth daily.    . rosuvastatin (CRESTOR) 40 MG tablet Take 40 mg by mouth daily.    Marland Kitchen spironolactone (ALDACTONE) 25 MG tablet TAKE 1 TABLET BY MOUTH EVERY DAY 60 tablet 0  . verapamil (CALAN-SR) 180 MG CR tablet Take 180 mg by mouth in the morning and at bedtime.     No current facility-administered medications for this visit.    Allergies as of 07/27/2020  . (No Known Allergies)    Family History  Problem Relation Age of Onset  . Colon cancer Mother        diagnosed at age 69, passed away at age 18 from metastasis  . Heart attack Father   . Prostate cancer Brother   . COPD Brother     Social History   Socioeconomic History  . Marital status: Single    Spouse name: Not on file  . Number of children: 0  . Years of education: Not on file  . Highest  education level: Not on file  Occupational History  . Occupation: full-time    Employer: [employer]  Tobacco Use  . Smoking status: Current Every Day Smoker    Packs/day: 0.50    Years: 15.00    Pack years: 7.50    Types: Cigarettes  . Smokeless tobacco: Never Used  Vaping Use  . Vaping Use: Never used  Substance and Sexual Activity  . Alcohol use: No  . Drug use: No  . Sexual activity: Yes    Birth control/protection: Surgical  Other Topics Concern  . Not on file  Social History Narrative  . Not on file   Social Determinants of Health   Financial Resource Strain: Not on file  Food Insecurity: Not on file  Transportation Needs: Not on file  Physical Activity: Not on file  Stress: Not on file  Social Connections: Not on file    Review of Systems: Gen: Denies fever, chills, anorexia. Denies fatigue, weakness, weight loss.  CV: Denies chest pain, palpitations, syncope, peripheral edema, and claudication. Resp: Denies dyspnea at rest, cough, wheezing, coughing up blood, and pleurisy. GI: see HPI Derm: Denies rash, itching, dry skin Psych: Denies depression, anxiety, memory loss, confusion. No homicidal or suicidal ideation.  Heme: Denies bruising, bleeding, and enlarged lymph nodes.  Physical Exam: BP (!) 157/88   Pulse 80   Temp (!) 97.5 F (36.4 C)   Ht 5\' 3"  (1.6 m)   Wt 206 lb 3.2 oz (93.5 kg)   BMI 36.53 kg/m  General:   Alert and oriented. No distress noted. Pleasant and cooperative.  Head:  Normocephalic and atraumatic. Eyes:  Conjuctiva clear without scleral icterus. Mouth:  Mask in place Abdomen:  +BS, soft, non-tender and non-distended. No rebound or guarding. No HSM or masses noted. Msk:  Symmetrical without gross deformities. Normal posture. Extremities:  Without edema. Neurologic:  Alert and  oriented x4 Psych:  Alert and cooperative. Normal mood and affect.  ASSESSMENT: Mariah Stewart is a 65 y.o. female presenting today presenting  today with a history of GERD, constipation, recent notable weight loss with weight initially 263 in Sept 2020, 216 in Nov 2021, and now 206 today. History of hyperthyroidism 40 years ago undergoing radioactive iodine treatments and now hypothyroid. TSH just checked 3/9 and blood work will be checked again in 4 weeks. TSH was 12.100, increased from 7 in Nov 2021. Had not been taking Synthroid on empty stomach.   Evaluation thus far with colonoscopy/EGD overall unrevealing. Surveillance colonoscopy due in 2024 due to borderline prep and adenomas. She has noted improvement in appetite. Labs overall normal except for TSH, which was markedly elevated and likely due to decreased efficacy of taking Synthroid with other meds. She is being followed closely by PCP for this. Typically, would not expect weight loss with elevated TSH, but she certainly has endocrine abnormalities.   As she clinically feels improved, will need to follow closely. Will see her in 6 weeks for weight check. She will call if persistent weight loss. Will need to pursue CT if any additional weight loss.    PLAN:  Keep close follow-up with PCP Return in 6 weeks CT if persistent weight loss Colonoscopy in Dec 2024  Annitta Needs, PhD, [facility]

## 2020-07-27 NOTE — Patient Instructions (Signed)
Please call us if you continue to lose weight. Otherwise, we will see you in about 6 weeks.  IF you continue to lose weight, we will order a CT scan.  Your next colonoscopy will be in Dec 2024.   Please call with any concerns in the meantime!  It was a pleasure to see you today. I want to create trusting relationships with patients to provide genuine, compassionate, and quality care. I value your feedback. If you receive a survey regarding your visit,  I greatly appreciate you taking time to fill this out.   Annitta Needs, PhD, ANP-BC [facility]

## 2020-08-03 NOTE — Progress Notes (Signed)
CC'ED TO PCP 

## 2020-08-26 ENCOUNTER — Other Ambulatory Visit: Payer: Self-pay

## 2020-08-26 ENCOUNTER — Ambulatory Visit: Payer: Medicare Other | Admitting: Cardiology

## 2020-08-26 ENCOUNTER — Encounter: Payer: Self-pay | Admitting: Cardiology

## 2020-08-26 ENCOUNTER — Encounter: Payer: Self-pay | Admitting: *Deleted

## 2020-08-26 VITALS — BP 120/64 | HR 60 | Ht 63.0 in | Wt 203.6 lb

## 2020-08-26 DIAGNOSIS — E782 Mixed hyperlipidemia: Secondary | ICD-10-CM | POA: Diagnosis not present

## 2020-08-26 DIAGNOSIS — I2510 Atherosclerotic heart disease of native coronary artery without angina pectoris: Secondary | ICD-10-CM

## 2020-08-26 DIAGNOSIS — I10 Essential (primary) hypertension: Secondary | ICD-10-CM

## 2020-08-26 DIAGNOSIS — I5032 Chronic diastolic (congestive) heart failure: Secondary | ICD-10-CM

## 2020-08-26 NOTE — Progress Notes (Signed)
Clinical Summary Mariah Stewart is a 65 y.o.female  seen today for follow up of the following medical problems.   1.Chronic diastolic HF - 08/979 echo LVEF 19-14%, grade I diastolic dysfunction   - some swelling at times. Has lasix 40mg  prn, has not used.  - no SOB or DOE   2. CAD - history of prior CABG in 2000  - no recent chest pains - compliant with meds  3. HTN - compliant with meds  4. Hyperlipidemia - stopped lipitor by pcp due to leg pains Jan 2nd. 10/2018 lipid panel LDL 218 - started on crestor at that time which she is tolerating.   12/2019 TC 140 TG 122 HDL 39 LDL 79 - compliant with crestor  SH: recent cruise to [place]. 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 Original disability claim was denied x3, working on appeal.     Past Medical History:  Diagnosis Date  . Arthritis   . Atherosclerotic heart disease of native coronary artery with unspecified angina pectoris ([place])   . CAD (coronary artery disease)    a. s/p CABG in 2000  . Essential (primary) hypertension   . GERD (gastroesophageal reflux disease)   . Heart disease   . Hx of adenomatous colonic polyps   . Hypercholesterolemia   . Hyperlipidemia, unspecified   . Hypertension   . Hypothyroidism   . Hypothyroidism, unspecified   . Menopausal and female climacteric states   . Menopause   . Nicotine dependence, unspecified, uncomplicated   . Other obesity   . Pain in right foot   . Sleep apnea    pt doesn't wear her CPAP machine-PCP knows patient unable to wear CPAP  . Sleep apnea, unspecified   . Venous insufficiency (chronic) (peripheral)      No Known Allergies   Current Outpatient Medications  Medication Sig Dispense Refill  . aspirin 81 MG tablet Take 81 mg by mouth daily.    . chlorthalidone (HYGROTON) 25 MG tablet TAKE 1 TABLET BY MOUTH EVERY DAY (Patient taking differently:  Take 25 mg by mouth daily.) 90 tablet 3  . furosemide (LASIX) 40 MG tablet Take 40 mg by mouth daily as needed for fluid or edema.   5  . KLOR-CON M20 20 MEQ tablet TAKE 2 TABLETS BY MOUTH DAILY (Patient taking differently: Take 20 mEq by mouth daily.) 180 tablet 3  . levothyroxine (SYNTHROID) 200 MCG tablet Take 200 mcg by mouth daily before breakfast.     . losartan (COZAAR) 100 MG tablet Take 100 mg by mouth daily.    . Magnesium Oxide -Mg Supplement 400 MG CAPS TAKE 1 CAPSULE BY MOUTH DAILY. (Patient taking differently: Take 400 mg by mouth daily.) 90 capsule 3  . omeprazole (PRILOSEC) 40 MG capsule Take 40 mg by mouth daily.    . rosuvastatin (CRESTOR) 40 MG tablet Take 40 mg by mouth daily.    Marland Kitchen spironolactone (ALDACTONE) 25 MG tablet TAKE 1 TABLET BY MOUTH EVERY DAY 60 tablet 0  . verapamil (CALAN-SR) 180 MG CR tablet Take 180 mg by mouth in the morning and at bedtime.     No current facility-administered medications for this visit.     Past Surgical History:  Procedure Laterality Date  . ABDOMINAL HYSTERECTOMY    . BIOPSY  04/27/2020   Procedure: BIOPSY;  Surgeon: Eloise Harman, DO;  [facility];  Service: Endoscopy;;  .  BONE EXOSTOSIS EXCISION  04/25/2012   Procedure: EXOSTOSIS EXCISION;  Surgeon: Marcheta Grammes, DPM;  Location: [facility];  Service: Orthopedics;  Laterality: Left;  Retrocalcaneal Exostectomy Left Foot  . CATARACT EXTRACTION W/PHACO Right 09/08/2013   Procedure: CATARACT EXTRACTION PHACO AND INTRAOCULAR LENS PLACEMENT (IOC);  Surgeon: Tonny Reinhard Schack, MD;  Location: [facility];  Service: Ophthalmology;  Laterality: Right;  CDE 9.13  . CHOLECYSTECTOMY    . COLONOSCOPY  04/16/07   diminutive rectal polyp removed, scattered diverticula, path: tubular adenoma  . COLONOSCOPY  04/14/02   external hemorrhoids otherwise normal  . COLONOSCOPY  05/24/2011   Dr. Rourk:tubular adenoma and hyperplastic polyp, long redundant colon/left sided diverticulosis/internal  hemorrhoids, incomplete examination, unable to reach the cecum despite external abdominal pressure, changing position. Barium enema incomplete.   . COLONOSCOPY N/A 02/05/2015   Procedure: COLONOSCOPY;  Surgeon: Daneil Dolin, MD;  elongated colon, redundant colon-incomplete with inability to reach to deeply intubate the cecum despite external pressure and changing the patients position.   . COLONOSCOPY WITH PROPOFOL N/A 04/27/2020   non-bleeding internal hemorrhoids, sigmoid and descending colon diverticulosis, one 2 mm polyp in transverse colon, stool in entire examined colon. Surveillance 3 years due to borderline prep. Tubular adenoma.   . CORONARY ARTERY BYPASS GRAFT  2000   triple bypass  . ESOPHAGOGASTRODUODENOSCOPY (EGD) WITH PROPOFOL N/A 04/27/2020   gastritis s/p biopsy, normal duodenum. Benign duodenal biopsy. No H.pylori.   . S/P Hysterectomy       No Known Allergies    Family History  Problem Relation Age of Onset  . Colon cancer Mother        diagnosed at age 50, passed away at age 81 from metastasis  . Heart attack Father   . Prostate cancer Brother   . COPD Brother      Social History Ms. Schuhmacher reports that she has been smoking cigarettes. She has a 7.50 pack-year smoking history. She has never used smokeless tobacco. Ms. Sylvain reports no history of alcohol use.   Review of Systems CONSTITUTIONAL: No weight loss, fever, chills, weakness or fatigue.  HEENT: Eyes: No visual loss, blurred vision, double vision or yellow sclerae.No hearing loss, sneezing, congestion, runny nose or sore throat.  SKIN: No rash or itching.  CARDIOVASCULAR: per hpi RESPIRATORY: No shortness of breath, cough or sputum.  GASTROINTESTINAL: No anorexia, nausea, vomiting or diarrhea. No abdominal pain or blood.  GENITOURINARY: No burning on urination, no polyuria NEUROLOGICAL: No headache, dizziness, syncope, paralysis, ataxia, numbness or tingling in the extremities. No change in bowel or  bladder control.  MUSCULOSKELETAL: No muscle, back pain, joint pain or stiffness.  LYMPHATICS: No enlarged nodes. No history of splenectomy.  PSYCHIATRIC: No history of depression or anxiety.  ENDOCRINOLOGIC: No reports of sweating, cold or heat intolerance. No polyuria or polydipsia.  Marland Kitchen   Physical Examination Today's Vitals   08/26/20 0911  BP: 120/64  Pulse: 60  SpO2: 98%  Weight: 203 lb 9.6 oz (92.4 kg)  Height: 5\' 3"  (1.6 m)   Body mass index is 36.07 kg/m.  Gen: resting comfortably, no acute distress HEENT: no scleral icterus, pupils equal round and reactive, no palptable cervical adenopathy,  CV: RRR, no /r/g no jvd Resp: Clear to auscultation bilaterally GI: abdomen is soft, non-tender, non-distended, normal bowel sounds, no hepatosplenomegaly MSK: extremities are warm, trace bilateral edema.  Skin: warm, no rash Neuro:  no focal deficits Psych: appropriate affect   Diagnostic Studies 12/2016 echo Study Conclusions  - Left  ventricle: The cavity size was normal. Wall thickness was increased in a pattern of moderate LVH. Systolic function was normal. The estimated ejection fraction was in the range of 55% to 60%. Wall motion was normal; there were no regional wall motion abnormalities. Doppler parameters are consistent with abnormal left ventricular relaxation (grade 1 diastolic dysfunction). Doppler parameters are consistent with high ventricular filling pressure. - Aortic valve: Mildly calcified annulus. Mildly thickened leaflets. Valve area (VTI): 1.96 cm^2. Valve area (Vmax): 1.76 cm^2. - Mitral valve: Mildly calcified annulus. Mildly thickened leaflets . - Technically difficult study.    Assessment and Plan  1. Chronic diastolic HF -no symptoms, continue prn lasix  2.Resistant HTN - at goal, continue current meds  3. CAD - no recent symptoms, continue current meds  4. Hyperlipidemia -essentially at goal, will request  most recent labs from pcp - continue statin   F/u 1 year  Arnoldo Lenis, M.D.

## 2020-08-26 NOTE — Patient Instructions (Signed)

## 2020-08-31 DIAGNOSIS — E039 Hypothyroidism, unspecified: Secondary | ICD-10-CM | POA: Diagnosis not present

## 2020-09-08 ENCOUNTER — Encounter: Payer: Self-pay | Admitting: Gastroenterology

## 2020-09-08 ENCOUNTER — Telehealth: Payer: Self-pay

## 2020-09-08 ENCOUNTER — Other Ambulatory Visit: Payer: Self-pay

## 2020-09-08 ENCOUNTER — Ambulatory Visit: Payer: Medicare Other | Admitting: Gastroenterology

## 2020-09-08 VITALS — BP 145/92 | HR 86 | Temp 97.1°F | Ht 63.0 in | Wt 201.4 lb

## 2020-09-08 DIAGNOSIS — K219 Gastro-esophageal reflux disease without esophagitis: Secondary | ICD-10-CM | POA: Diagnosis not present

## 2020-09-08 DIAGNOSIS — R634 Abnormal weight loss: Secondary | ICD-10-CM | POA: Diagnosis not present

## 2020-09-08 NOTE — Progress Notes (Signed)
Referring Provider: Celene Squibb, MD Primary Care Physician:  Celene Squibb, MD Primary GI: Dr. Gala Romney   Chief Complaint  Patient presents with  . Gastroesophageal Reflux    ok  . Constipation    ok    HPI:   Mariah Stewart is a 65 y.o. female presenting today with a history of GERD, constipation, recent notable weight loss with weight initially 263 in Sept 2020, 216 in Nov 2021, and 206 in March 2022. Today 201.   History of hyperthyroidism 40 years ago undergoing radioactive iodine treatments and now hypothyroid. TSH  checked 3/9 , and TSH was 12.100, increased from 7 in Nov 2021. Had not been taking Synthroid on empty stomach.   Colonoscopy and EGD recently completed Dec 2021. Surveillance colonoscopy due in 2024 due to borderline prep and history of adenomas.    Miralax as needed. Omeprazole once per day. Appetite is better but still poor. Still eating one meal a day. Food beginning to taste better. Never had Covid. No rectal bleeding. Energy is good. No abdominal pain. No nausea or vomiting.   Past Medical History:  Diagnosis Date  . Arthritis   . Atherosclerotic heart disease of native coronary artery with unspecified angina pectoris ([place])   . CAD (coronary artery disease)    a. s/p CABG in 2000  . Essential (primary) hypertension   . GERD (gastroesophageal reflux disease)   . Heart disease   . Hx of adenomatous colonic polyps   . Hypercholesterolemia   . Hyperlipidemia, unspecified   . Hypertension   . Hypothyroidism   . Hypothyroidism, unspecified   . Menopausal and female climacteric states   . Menopause   . Nicotine dependence, unspecified, uncomplicated   . Other obesity   . Pain in right foot   . Sleep apnea    pt doesn't wear her CPAP machine-PCP knows patient unable to wear CPAP  . Sleep apnea, unspecified   . Venous insufficiency (chronic) (peripheral)     Past Surgical History:  Procedure Laterality Date  . ABDOMINAL HYSTERECTOMY    .  BIOPSY  04/27/2020   Procedure: BIOPSY;  Surgeon: Eloise Harman, DO;  [facility];  Service: Endoscopy;;  . BONE EXOSTOSIS EXCISION  04/25/2012   Procedure: EXOSTOSIS EXCISION;  Surgeon: Marcheta Grammes, DPM;  Location: [facility];  Service: Orthopedics;  Laterality: Left;  Retrocalcaneal Exostectomy Left Foot  . CATARACT EXTRACTION W/PHACO Right 09/08/2013   Procedure: CATARACT EXTRACTION PHACO AND INTRAOCULAR LENS PLACEMENT (IOC);  Surgeon: Tonny Branch, MD;  Location: [facility];  Service: Ophthalmology;  Laterality: Right;  CDE 9.13  . CHOLECYSTECTOMY    . COLONOSCOPY  04/16/07   diminutive rectal polyp removed, scattered diverticula, path: tubular adenoma  . COLONOSCOPY  04/14/02   external hemorrhoids otherwise normal  . COLONOSCOPY  05/24/2011   Dr. Rourk:tubular adenoma and hyperplastic polyp, long redundant colon/left sided diverticulosis/internal hemorrhoids, incomplete examination, unable to reach the cecum despite external abdominal pressure, changing position. Barium enema incomplete.   . COLONOSCOPY N/A 02/05/2015   Procedure: COLONOSCOPY;  Surgeon: Daneil Dolin, MD;  elongated colon, redundant colon-incomplete with inability to reach to deeply intubate the cecum despite external pressure and changing the patients position.   . COLONOSCOPY WITH PROPOFOL N/A 04/27/2020   non-bleeding internal hemorrhoids, sigmoid and descending colon diverticulosis, one 2 mm polyp in transverse colon, stool in entire examined colon. Surveillance 3 years due to borderline prep. Tubular adenoma.   Marland Kitchen  CORONARY ARTERY BYPASS GRAFT  2000   triple bypass  . ESOPHAGOGASTRODUODENOSCOPY (EGD) WITH PROPOFOL N/A 04/27/2020   gastritis s/p biopsy, normal duodenum. Benign duodenal biopsy. No H.pylori.   . S/P Hysterectomy      Current Outpatient Medications  Medication Sig Dispense Refill  . aspirin 81 MG tablet Take 81 mg by mouth daily.    . chlorthalidone (HYGROTON) 25 MG tablet TAKE 1 TABLET BY  MOUTH EVERY DAY (Patient taking differently: Take 25 mg by mouth daily.) 90 tablet 3  . furosemide (LASIX) 40 MG tablet Take 40 mg by mouth daily as needed for fluid or edema.   5  . KLOR-CON M20 20 MEQ tablet TAKE 2 TABLETS BY MOUTH DAILY (Patient taking differently: Take 20 mEq by mouth daily.) 180 tablet 3  . levothyroxine (SYNTHROID) 200 MCG tablet Take 200 mcg by mouth daily before breakfast.     . losartan (COZAAR) 100 MG tablet Take 100 mg by mouth daily.    . Magnesium Oxide -Mg Supplement 400 MG CAPS TAKE 1 CAPSULE BY MOUTH DAILY. (Patient taking differently: Take 400 mg by mouth daily.) 90 capsule 3  . omeprazole (PRILOSEC) 40 MG capsule Take 40 mg by mouth daily.    . rosuvastatin (CRESTOR) 40 MG tablet Take 40 mg by mouth daily.    Marland Kitchen spironolactone (ALDACTONE) 25 MG tablet TAKE 1 TABLET BY MOUTH EVERY DAY 60 tablet 0  . verapamil (CALAN-SR) 180 MG CR tablet Take 180 mg by mouth in the morning and at bedtime.     No current facility-administered medications for this visit.    Allergies as of 09/08/2020  . (No Known Allergies)    Family History  Problem Relation Age of Onset  . Colon cancer Mother        diagnosed at age 34, passed away at age 63 from metastasis  . Heart attack Father   . Prostate cancer Brother   . COPD Brother     Social History   Socioeconomic History  . Marital status: Single    Spouse name: Not on file  . Number of children: 0  . Years of education: Not on file  . Highest education level: Not on file  Occupational History  . Occupation: full-time    Employer: [employer]  Tobacco Use  . Smoking status: Current Every Day Smoker    Packs/day: 0.50    Years: 15.00    Pack years: 7.50    Types: Cigarettes  . Smokeless tobacco: Never Used  Vaping Use  . Vaping Use: Never used  Substance and Sexual Activity  . Alcohol use: No  . Drug use: No  . Sexual activity: Yes    Birth control/protection: Surgical  Other Topics Concern  .  Not on file  Social History Narrative  . Not on file   Social Determinants of Health   Financial Resource Strain: Not on file  Food Insecurity: Not on file  Transportation Needs: Not on file  Physical Activity: Not on file  Stress: Not on file  Social Connections: Not on file    Review of Systems: Gen: Denies fever, chills, anorexia. Denies fatigue, weakness, weight loss.  CV: Denies chest pain, palpitations, syncope, peripheral edema, and claudication. Resp: Denies dyspnea at rest, cough, wheezing, coughing up blood, and pleurisy. GI: see HPI Derm: Denies rash, itching, dry skin Psych: Denies depression, anxiety, memory loss, confusion. No homicidal or suicidal ideation.  Heme: Denies bruising, bleeding, and enlarged lymph nodes.  Physical  Exam: BP (!) 145/92   Pulse 86   Temp (!) 97.1 F (36.2 C) (Temporal)   Ht 5\' 3"  (1.6 m)   Wt 201 lb 6.4 oz (91.4 kg)   BMI 35.68 kg/m  General:   Alert and oriented. No distress noted. Pleasant and cooperative.  Head:  Normocephalic and atraumatic. Eyes:  Conjuctiva clear without scleral icterus. Mouth:  Mask in place Abdomen:  +BS, soft, non-tender and non-distended. No rebound or guarding. No HSM or masses noted. Msk:  Symmetrical without gross deformities. Normal posture. Extremities:  Without edema. Neurologic:  Alert and  oriented x4 Psych:  Alert and cooperative. Normal mood and affect.  ASSESSMENT: Mariah Stewart is a delightful 65 y.o. female presenting today with history of GERD, constipation, unintended weight loss of at least 60 lbs since Sept 2020 and has been steadily losing. Sept 2020 was 263, Nov 2021 was 216, March 2022 was 206, and today she is 201.   Notes decreased appetite although this has improved from last visit. TSH was 12 in March 2022 but had not been taking Synthroid on empty stomach. PCP following. She has had colonoscopy/EGD overall unrevealing, with surveillance colonoscopy due tin 2024 due to borderline  prep and adenomas.   GERD: well controlled on PPI.    PLAN:  CT abd/pelvis with contrast and BMP prior Further recommendations to follow  Annitta Needs, PhD, ANP-BC [facility]

## 2020-09-08 NOTE — Telephone Encounter (Signed)
CT abd/pelvis w/contrast scheduled for 10/05/20 at 5:00pm, arrive at 4:45pm. NPO 4 hours prior to test. Pickup contrast before day of test.   Tried to call pt to inform her of CT appt, no answer. Appt letter mailed.  No PA needed for CT per [facility] website.

## 2020-09-08 NOTE — Patient Instructions (Signed)
We are arranging a CT scan due to unintended weight loss.  Further recommendations to follow!   I enjoyed seeing you again today! As you know, I value our relationship and want to provide genuine, compassionate, and quality care. I welcome your feedback. If you receive a survey regarding your visit,  I greatly appreciate you taking time to fill this out. See you next time!  Annitta Needs, PhD, ANP-BC [facility]

## 2020-09-09 NOTE — Progress Notes (Signed)
Cc'ed to pcp °

## 2020-09-10 DIAGNOSIS — E039 Hypothyroidism, unspecified: Secondary | ICD-10-CM | POA: Diagnosis not present

## 2020-09-14 ENCOUNTER — Telehealth: Payer: Self-pay

## 2020-09-14 NOTE — Telephone Encounter (Signed)
Mariah Stewart I am putting bloodwork from [facility] on your desk. This came over , I dont know if you requested or not.

## 2020-09-15 NOTE — Telephone Encounter (Signed)
Labs from July 21, 2020 reviewed.   Hgb 13.1, Hct 40.2, Creatinine 1.15, BUN 17, Tbili 0.3, Alk Phos 102, AST 18, ALT 11, TSH 12.100.

## 2020-10-04 ENCOUNTER — Other Ambulatory Visit: Payer: Self-pay

## 2020-10-04 ENCOUNTER — Other Ambulatory Visit (HOSPITAL_COMMUNITY)
Admission: RE | Admit: 2020-10-04 | Discharge: 2020-10-04 | Disposition: A | Discharge status: Home / Self Care | Payer: Medicare Other | Source: Ambulatory Visit | Attending: Gastroenterology | Admitting: Gastroenterology

## 2020-10-04 DIAGNOSIS — R634 Abnormal weight loss: Secondary | ICD-10-CM | POA: Insufficient documentation

## 2020-10-04 LAB — BASIC METABOLIC PANEL
Anion gap: 7 (ref 5–15)
BUN: 21 mg/dL (ref 8–23)
CO2: 28 mmol/L (ref 22–32)
Calcium: 9.8 mg/dL (ref 8.9–10.3)
Chloride: 104 mmol/L (ref 98–111)
Creatinine, Ser: 1.2 mg/dL — ABNORMAL HIGH (ref 0.44–1.00)
GFR, Estimated: 50 mL/min — ABNORMAL LOW (ref >60)
Glucose, Bld: 97 mg/dL (ref 70–99)
Potassium: 4.1 mmol/L (ref 3.5–5.1)
Sodium: 139 mmol/L (ref 135–145)

## 2020-10-05 ENCOUNTER — Ambulatory Visit (HOSPITAL_COMMUNITY)
Admission: RE | Admit: 2020-10-05 | Discharge: 2020-10-05 | Disposition: A | Discharge status: Home / Self Care | Payer: Medicare Other | Source: Ambulatory Visit | Attending: Gastroenterology | Admitting: Gastroenterology

## 2020-10-05 DIAGNOSIS — R634 Abnormal weight loss: Secondary | ICD-10-CM | POA: Diagnosis not present

## 2020-10-05 DIAGNOSIS — K5750 Diverticulosis of both small and large intestine without perforation or abscess without bleeding: Secondary | ICD-10-CM | POA: Diagnosis not present

## 2020-10-05 DIAGNOSIS — E278 Other specified disorders of adrenal gland: Secondary | ICD-10-CM | POA: Diagnosis not present

## 2020-10-05 DIAGNOSIS — I700 Atherosclerosis of aorta: Secondary | ICD-10-CM | POA: Diagnosis not present

## 2020-10-05 MED ORDER — IOHEXOL 300 MG/ML  SOLN
100.0000 mL | Freq: Once | INTRAMUSCULAR | Status: AC | PRN
  Administered 2020-10-05: 100 mL via INTRAVENOUS

## 2020-12-06 ENCOUNTER — Other Ambulatory Visit: Payer: Self-pay

## 2020-12-10 ENCOUNTER — Other Ambulatory Visit: Payer: Self-pay | Admitting: Cardiology

## 2021-01-05 DIAGNOSIS — I10 Essential (primary) hypertension: Secondary | ICD-10-CM | POA: Diagnosis not present

## 2021-01-10 DIAGNOSIS — I10 Essential (primary) hypertension: Secondary | ICD-10-CM | POA: Diagnosis not present

## 2021-01-10 DIAGNOSIS — E039 Hypothyroidism, unspecified: Secondary | ICD-10-CM | POA: Diagnosis not present

## 2021-01-10 DIAGNOSIS — M25532 Pain in left wrist: Secondary | ICD-10-CM | POA: Diagnosis not present

## 2021-01-10 DIAGNOSIS — R634 Abnormal weight loss: Secondary | ICD-10-CM | POA: Diagnosis not present

## 2021-02-02 DIAGNOSIS — Z23 Encounter for immunization: Secondary | ICD-10-CM | POA: Diagnosis not present

## 2021-02-02 DIAGNOSIS — E039 Hypothyroidism, unspecified: Secondary | ICD-10-CM | POA: Diagnosis not present

## 2021-03-27 ENCOUNTER — Other Ambulatory Visit: Payer: Self-pay | Admitting: Cardiology

## 2021-04-14 ENCOUNTER — Ambulatory Visit: Payer: Medicare Other | Admitting: Gastroenterology

## 2021-06-07 DIAGNOSIS — E039 Hypothyroidism, unspecified: Secondary | ICD-10-CM | POA: Diagnosis not present

## 2021-06-07 DIAGNOSIS — I10 Essential (primary) hypertension: Secondary | ICD-10-CM | POA: Diagnosis not present

## 2021-06-10 DIAGNOSIS — R634 Abnormal weight loss: Secondary | ICD-10-CM | POA: Diagnosis not present

## 2021-06-10 DIAGNOSIS — E039 Hypothyroidism, unspecified: Secondary | ICD-10-CM | POA: Diagnosis not present

## 2021-06-10 DIAGNOSIS — I10 Essential (primary) hypertension: Secondary | ICD-10-CM | POA: Diagnosis not present

## 2021-06-10 DIAGNOSIS — E782 Mixed hyperlipidemia: Secondary | ICD-10-CM | POA: Diagnosis not present

## 2021-06-10 DIAGNOSIS — M792 Neuralgia and neuritis, unspecified: Secondary | ICD-10-CM | POA: Diagnosis not present

## 2021-06-10 DIAGNOSIS — M25532 Pain in left wrist: Secondary | ICD-10-CM | POA: Diagnosis not present

## 2021-06-10 DIAGNOSIS — N1831 Chronic kidney disease, stage 3a: Secondary | ICD-10-CM | POA: Diagnosis not present

## 2021-06-10 DIAGNOSIS — I5032 Chronic diastolic (congestive) heart failure: Secondary | ICD-10-CM | POA: Diagnosis not present

## 2021-06-10 DIAGNOSIS — K219 Gastro-esophageal reflux disease without esophagitis: Secondary | ICD-10-CM | POA: Diagnosis not present

## 2021-06-10 DIAGNOSIS — I2510 Atherosclerotic heart disease of native coronary artery without angina pectoris: Secondary | ICD-10-CM | POA: Diagnosis not present

## 2021-06-20 DIAGNOSIS — M792 Neuralgia and neuritis, unspecified: Secondary | ICD-10-CM | POA: Diagnosis not present

## 2021-07-01 DIAGNOSIS — N1831 Chronic kidney disease, stage 3a: Secondary | ICD-10-CM | POA: Diagnosis not present

## 2021-07-01 DIAGNOSIS — E039 Hypothyroidism, unspecified: Secondary | ICD-10-CM | POA: Diagnosis not present

## 2021-07-08 ENCOUNTER — Other Ambulatory Visit (HOSPITAL_COMMUNITY): Payer: Self-pay | Admitting: Family Medicine

## 2021-07-08 ENCOUNTER — Other Ambulatory Visit: Payer: Self-pay | Admitting: Family Medicine

## 2021-07-08 DIAGNOSIS — I5032 Chronic diastolic (congestive) heart failure: Secondary | ICD-10-CM | POA: Diagnosis not present

## 2021-07-08 DIAGNOSIS — E039 Hypothyroidism, unspecified: Secondary | ICD-10-CM | POA: Diagnosis not present

## 2021-07-08 DIAGNOSIS — N1831 Chronic kidney disease, stage 3a: Secondary | ICD-10-CM | POA: Diagnosis not present

## 2021-07-08 DIAGNOSIS — R109 Unspecified abdominal pain: Secondary | ICD-10-CM | POA: Diagnosis not present

## 2021-07-08 DIAGNOSIS — M792 Neuralgia and neuritis, unspecified: Secondary | ICD-10-CM | POA: Diagnosis not present

## 2021-07-15 ENCOUNTER — Ambulatory Visit (HOSPITAL_COMMUNITY)
Admission: RE | Admit: 2021-07-15 | Discharge: 2021-07-15 | Disposition: A | Discharge status: Home / Self Care | Payer: Medicare Other | Source: Ambulatory Visit | Attending: Family Medicine | Admitting: Family Medicine

## 2021-07-15 ENCOUNTER — Other Ambulatory Visit: Payer: Self-pay

## 2021-07-15 DIAGNOSIS — R109 Unspecified abdominal pain: Secondary | ICD-10-CM | POA: Insufficient documentation

## 2021-07-15 DIAGNOSIS — R1084 Generalized abdominal pain: Secondary | ICD-10-CM | POA: Diagnosis not present

## 2021-08-15 DIAGNOSIS — E039 Hypothyroidism, unspecified: Secondary | ICD-10-CM | POA: Diagnosis not present

## 2021-08-22 DIAGNOSIS — E039 Hypothyroidism, unspecified: Secondary | ICD-10-CM | POA: Diagnosis not present

## 2021-08-29 ENCOUNTER — Other Ambulatory Visit (HOSPITAL_COMMUNITY): Payer: Self-pay | Admitting: Neurology

## 2021-08-29 DIAGNOSIS — M542 Cervicalgia: Secondary | ICD-10-CM

## 2021-08-29 DIAGNOSIS — M546 Pain in thoracic spine: Secondary | ICD-10-CM

## 2021-08-29 DIAGNOSIS — M5105 Intervertebral disc disorders with myelopathy, thoracolumbar region: Secondary | ICD-10-CM | POA: Diagnosis not present

## 2021-10-24 ENCOUNTER — Other Ambulatory Visit: Payer: Self-pay | Admitting: Cardiology

## 2021-10-25 DIAGNOSIS — E039 Hypothyroidism, unspecified: Secondary | ICD-10-CM | POA: Diagnosis not present

## 2021-11-01 DIAGNOSIS — M170 Bilateral primary osteoarthritis of knee: Secondary | ICD-10-CM | POA: Diagnosis not present

## 2021-11-01 DIAGNOSIS — E039 Hypothyroidism, unspecified: Secondary | ICD-10-CM | POA: Diagnosis not present

## 2021-11-25 IMAGING — MG DIGITAL SCREENING BILAT W/ TOMO W/ CAD
8 of 15 series · 8 of 40 positions shown · non-contrast
Comparison: Previous exam(s).

CLINICAL DATA: Screening.

EXAM:
DIGITAL SCREENING BILATERAL MAMMOGRAM WITH TOMO AND CAD

[L CC synth-2D (1 of 2)]
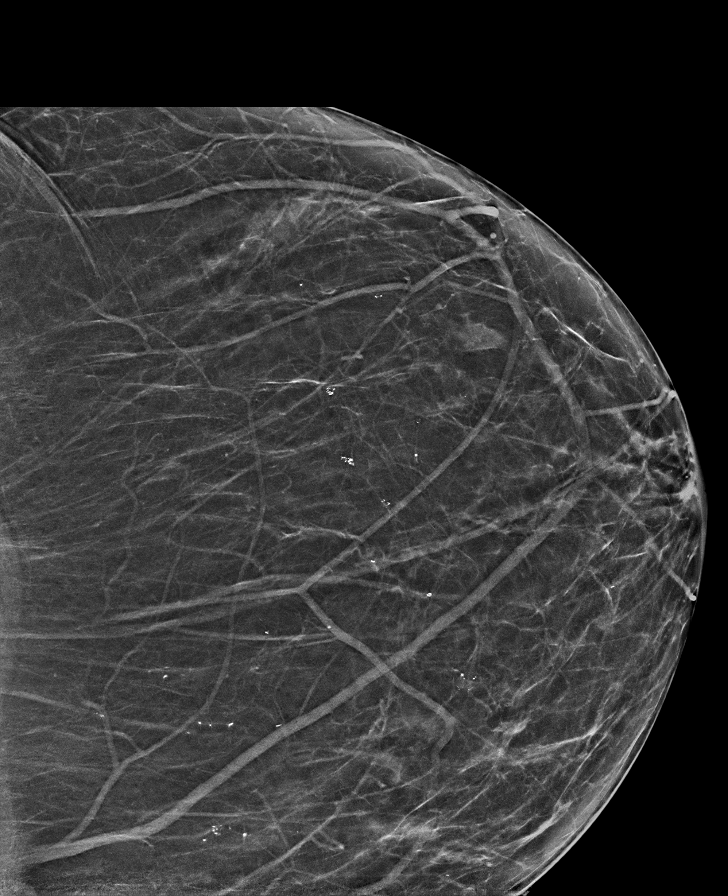

[L CC synth-2D (2 of 2)]
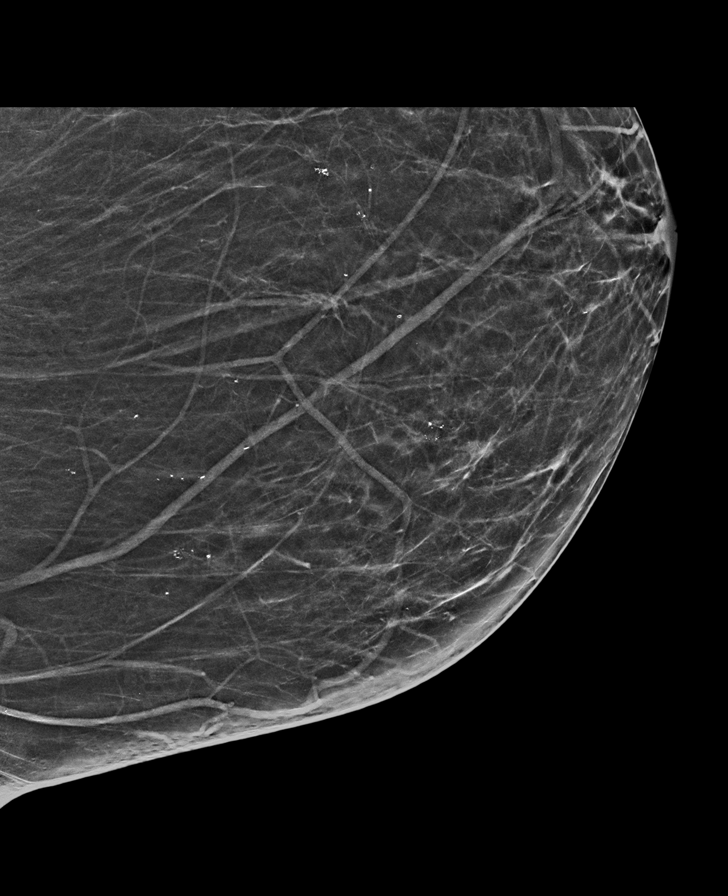

[R MLO synth-2D (1 of 2)]
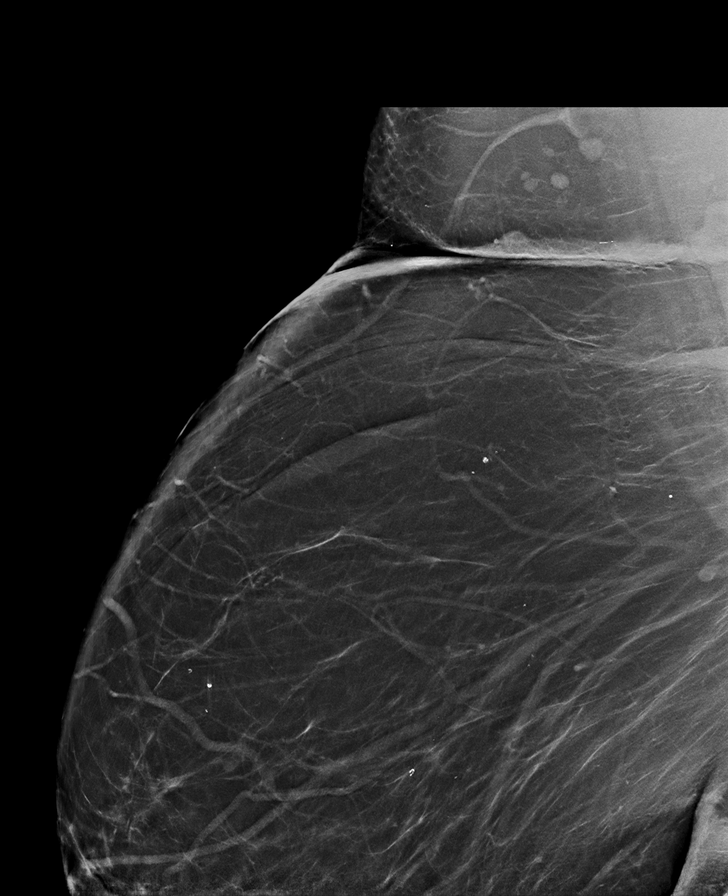

[R MLO synth-2D (2 of 2)]
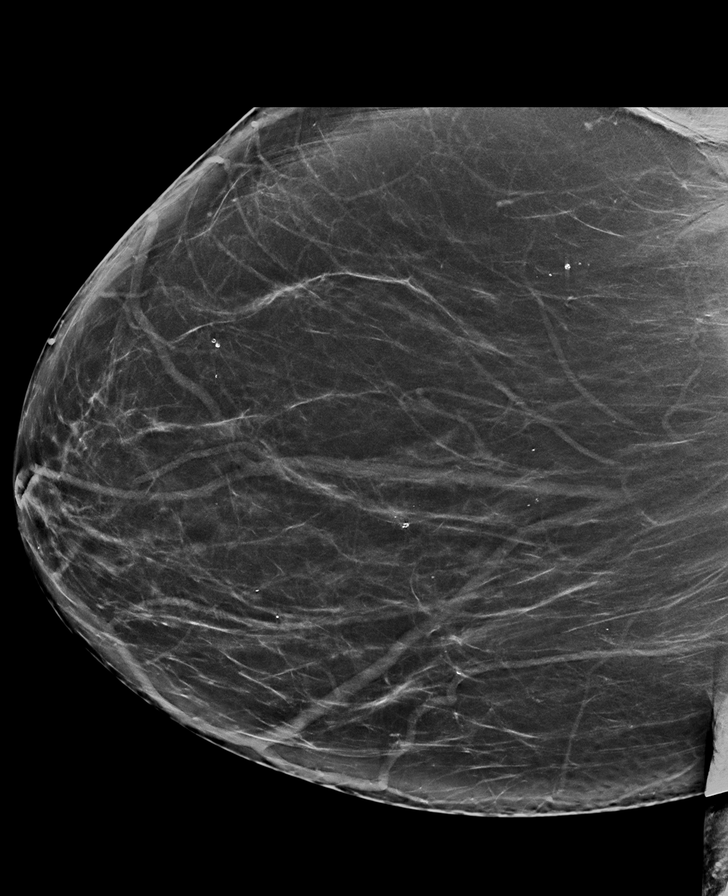

[L MLO synth-2D]
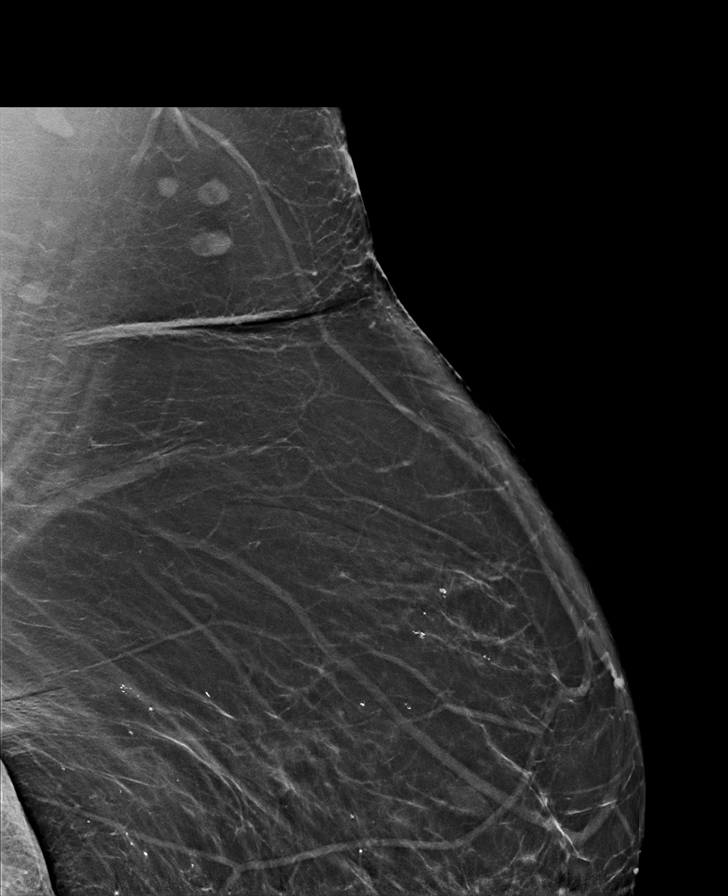

[R CC synth-2D (1 of 2)]
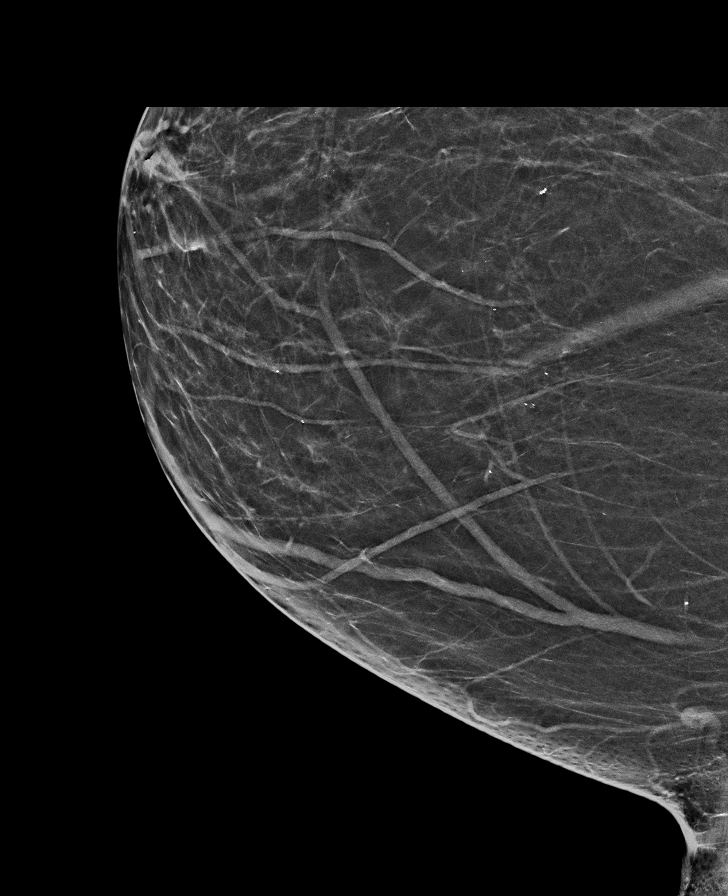

[R CC synth-2D (2 of 2)]
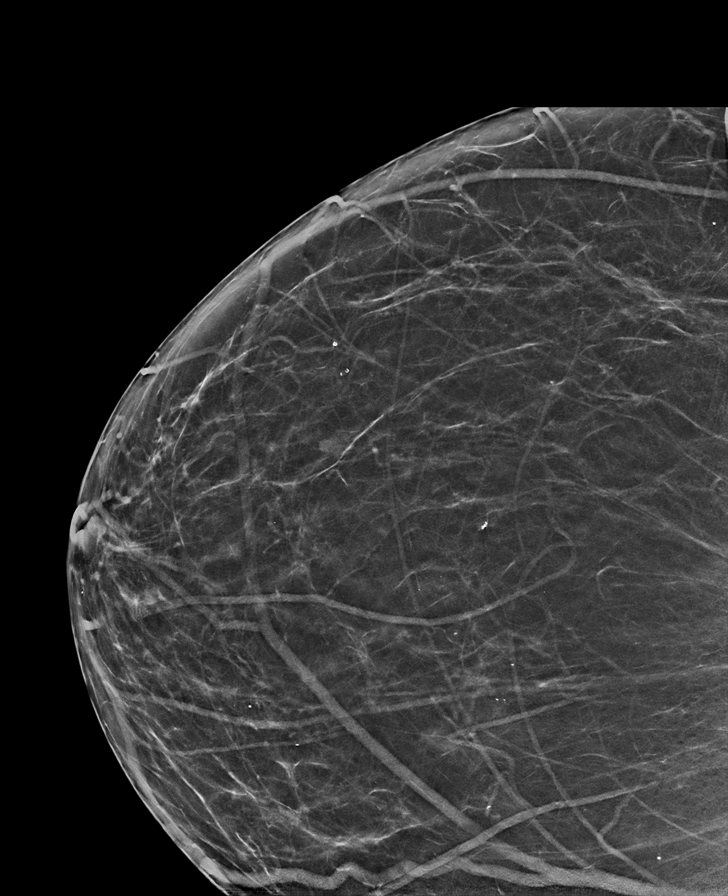

[L CC tomo · tomo slice 47/69.0]
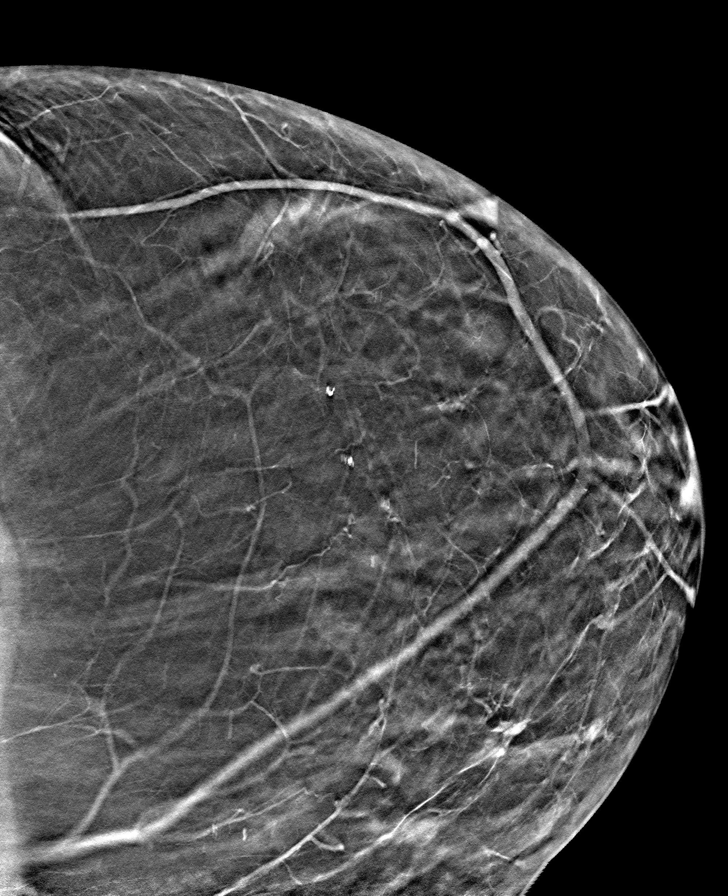

[8 of 40 positions shown; findings below may reference images not displayed]

ACR Breast Density Category b: There are scattered areas of
fibroglandular density.
FINDINGS: In the left breast, a possible focal asymmetry warrants further
evaluation. In the right breast, no findings suspicious for
malignancy. Images were processed with CAD.
IMPRESSION: Further evaluation is suggested for possible focal asymmetry in the
left breast.

RECOMMENDATION:
Diagnostic mammogram and possibly ultrasound of the left breast.
(Code:CY-7-RRI)

The patient will be contacted regarding the findings, and additional
imaging will be scheduled.

BI-RADS CATEGORY  0: Incomplete. Need additional imaging evaluation
and/or prior mammograms for comparison.

## 2021-12-12 DIAGNOSIS — E782 Mixed hyperlipidemia: Secondary | ICD-10-CM | POA: Diagnosis not present

## 2021-12-12 DIAGNOSIS — N1831 Chronic kidney disease, stage 3a: Secondary | ICD-10-CM | POA: Diagnosis not present

## 2021-12-12 DIAGNOSIS — I130 Hypertensive heart and chronic kidney disease with heart failure and stage 1 through stage 4 chronic kidney disease, or unspecified chronic kidney disease: Secondary | ICD-10-CM | POA: Diagnosis not present

## 2021-12-12 DIAGNOSIS — I5032 Chronic diastolic (congestive) heart failure: Secondary | ICD-10-CM | POA: Diagnosis not present

## 2021-12-29 ENCOUNTER — Other Ambulatory Visit: Payer: Self-pay | Admitting: Cardiology

## 2022-01-26 ENCOUNTER — Other Ambulatory Visit: Payer: Self-pay | Admitting: Cardiology

## 2022-02-14 ENCOUNTER — Other Ambulatory Visit: Payer: Self-pay | Admitting: Cardiology

## 2022-03-06 DIAGNOSIS — E039 Hypothyroidism, unspecified: Secondary | ICD-10-CM | POA: Diagnosis not present

## 2022-03-14 DIAGNOSIS — I10 Essential (primary) hypertension: Secondary | ICD-10-CM | POA: Diagnosis not present

## 2022-03-14 DIAGNOSIS — E782 Mixed hyperlipidemia: Secondary | ICD-10-CM | POA: Diagnosis not present

## 2022-03-14 DIAGNOSIS — Z1231 Encounter for screening mammogram for malignant neoplasm of breast: Secondary | ICD-10-CM | POA: Diagnosis not present

## 2022-03-14 DIAGNOSIS — K219 Gastro-esophageal reflux disease without esophagitis: Secondary | ICD-10-CM | POA: Diagnosis not present

## 2022-03-14 DIAGNOSIS — E039 Hypothyroidism, unspecified: Secondary | ICD-10-CM | POA: Diagnosis not present

## 2022-03-14 DIAGNOSIS — I25810 Atherosclerosis of coronary artery bypass graft(s) without angina pectoris: Secondary | ICD-10-CM | POA: Diagnosis not present

## 2022-03-14 DIAGNOSIS — R944 Abnormal results of kidney function studies: Secondary | ICD-10-CM | POA: Diagnosis not present

## 2022-03-14 DIAGNOSIS — M170 Bilateral primary osteoarthritis of knee: Secondary | ICD-10-CM | POA: Diagnosis not present

## 2022-05-03 ENCOUNTER — Other Ambulatory Visit: Payer: Self-pay | Admitting: Cardiology

## 2022-07-03 ENCOUNTER — Telehealth: Payer: Self-pay | Admitting: Cardiology

## 2022-07-03 MED ORDER — CHLORTHALIDONE 25 MG PO TABS: 25.0000 mg | ORAL_TABLET | Freq: Every day | ORAL | 0 refills | Status: DC

## 2022-07-03 MED ORDER — SPIRONOLACTONE 25 MG PO TABS: 25.0000 mg | ORAL_TABLET | Freq: Every day | ORAL | 0 refills | Status: DC

## 2022-07-03 NOTE — Telephone Encounter (Signed)
*  STAT* If patient is at the pharmacy, call can be transferred to refill team.   1. Which medications need to be refilled? (please list name of each medication and dose if known)  chlorthalidone (HYGROTON) 25 MG tablet spironolactone (ALDACTONE) 25 MG tablet  2. Which pharmacy/location (including street and city if local pharmacy) is medication to be sent to? [id number] [place], [place] - [address]   3. Do they need a 30 day or 90 day supply?  90 day supply  Patient states she is completely out of medication. She has been scheduled for 3/11 with Dr. BHarl Bowie

## 2022-07-03 NOTE — Telephone Encounter (Signed)
Only filled for 30 day supply as she has not bee seen since 08/26/2020.

## 2022-07-24 ENCOUNTER — Ambulatory Visit: Payer: Medicare Other | Attending: Cardiology | Admitting: Cardiology

## 2022-07-24 ENCOUNTER — Encounter: Payer: Self-pay | Admitting: Cardiology

## 2022-07-24 VITALS — BP 134/78 | HR 63 | Ht 63.0 in | Wt 222.4 lb

## 2022-07-24 DIAGNOSIS — I1A0 Resistant hypertension: Secondary | ICD-10-CM | POA: Diagnosis not present

## 2022-07-24 DIAGNOSIS — I2510 Atherosclerotic heart disease of native coronary artery without angina pectoris: Secondary | ICD-10-CM

## 2022-07-24 DIAGNOSIS — E782 Mixed hyperlipidemia: Secondary | ICD-10-CM | POA: Diagnosis not present

## 2022-07-24 DIAGNOSIS — I5032 Chronic diastolic (congestive) heart failure: Secondary | ICD-10-CM | POA: Diagnosis not present

## 2022-07-24 NOTE — Progress Notes (Signed)
Clinical Summary Mariah Stewart is a 67 y.o.femaleseen today for follow up of the following medical problems.       1. Chronic diastolic HF - [id number] echo LVEF 0000000, grade I diastolic dysfunction     - no recent edema, no SOB/DOE - has prn lasix, has not needed.      2. CAD - history of prior CABG in 2000   - no recent chest pains - compliant with meds   3. HTN - Mariah Stewart is compliant with meds     4. Hyperlipidemia    12/2019 TC 140 TG 122 HDL 39 LDL 79 02/2022 TC 134 TG 120 HDL 41 LDL 71 - compliant with crestor   SH: recent cruise to [place]. Enjoys going [place]. Plays slots Former Multimedia programmer for [employer]     Past Medical History:  Diagnosis Date   Arthritis    Atherosclerotic heart disease of native coronary artery with unspecified angina pectoris ([facility])    CAD (coronary artery disease)    a. s/p CABG in 2000   Essential (primary) hypertension    GERD (gastroesophageal reflux disease)    Heart disease    Hx of adenomatous colonic polyps    Hypercholesterolemia    Hyperlipidemia, unspecified    Hypertension    Hypothyroidism    Hypothyroidism, unspecified    Menopausal and female climacteric states    Menopause    Nicotine dependence, unspecified, uncomplicated    Other obesity    Pain in right foot    Sleep apnea    pt doesn't wear her CPAP machine-PCP knows patient unable to wear CPAP   Sleep apnea, unspecified    Venous insufficiency (chronic) (peripheral)      No Known Allergies   Current Outpatient Medications  Medication Sig Dispense Refill   aspirin 81 MG tablet Take 81 mg by mouth daily.     chlorthalidone (HYGROTON) 25 MG tablet Take 1 tablet (25 mg total) by mouth daily. 30 tablet 0   furosemide (LASIX) 40 MG tablet Take 40 mg by mouth daily as needed for fluid or edema.   5   KLOR-CON M20 20 MEQ tablet TAKE 2 TABLETS BY MOUTH DAILY (Patient taking differently: Take 20 mEq by mouth daily.) 180 tablet 3    levothyroxine (SYNTHROID) 200 MCG tablet Take 200 mcg by mouth daily before breakfast.      losartan (COZAAR) 100 MG tablet Take 100 mg by mouth daily.     Magnesium Oxide -Mg Supplement 400 MG CAPS TAKE 1 CAPSULE BY MOUTH DAILY. (Patient taking differently: Take 400 mg by mouth daily.) 90 capsule 3   omeprazole (PRILOSEC) 40 MG capsule Take 40 mg by mouth daily.     rosuvastatin (CRESTOR) 40 MG tablet Take 40 mg by mouth daily.     spironolactone (ALDACTONE) 25 MG tablet Take 1 tablet (25 mg total) by mouth daily. 30 tablet 0   verapamil (CALAN-SR) 180 MG CR tablet Take 180 mg by mouth in the morning and at bedtime.     No current facility-administered medications for this visit.     Past Surgical History:  Procedure Laterality Date   ABDOMINAL HYSTERECTOMY     BIOPSY  04/27/2020   Procedure: BIOPSY;  Surgeon: Eloise Harman, DO;  [facility];  Service: Endoscopy;;   BONE EXOSTOSIS EXCISION  04/25/2012   Procedure: EXOSTOSIS EXCISION;  Surgeon: Marcheta Grammes, DPM;  Location: [facility];  Service: Orthopedics;  Laterality: Left;  Retrocalcaneal Exostectomy Left Foot   CATARACT EXTRACTION W/PHACO Right 09/08/2013   Procedure: CATARACT EXTRACTION PHACO AND INTRAOCULAR LENS PLACEMENT (IOC);  Surgeon: Tonny Vanice Rappa, MD;  Location: [facility];  Service: Ophthalmology;  Laterality: Right;  CDE 9.13   CHOLECYSTECTOMY     COLONOSCOPY  04/16/07   diminutive rectal polyp removed, scattered diverticula, path: tubular adenoma   COLONOSCOPY  04/14/02   external hemorrhoids otherwise normal   COLONOSCOPY  05/24/2011   Dr. Rourk:tubular adenoma and hyperplastic polyp, long redundant colon/left sided diverticulosis/internal hemorrhoids, incomplete examination, unable to reach the cecum despite external abdominal pressure, changing position. Barium enema incomplete.    COLONOSCOPY N/A 02/05/2015   Procedure: COLONOSCOPY;  Surgeon: Daneil Dolin, MD;  elongated colon, redundant colon-incomplete  with inability to reach to deeply intubate the cecum despite external pressure and changing the patients position.    COLONOSCOPY WITH PROPOFOL N/A 04/27/2020   non-bleeding internal hemorrhoids, sigmoid and descending colon diverticulosis, one 2 mm polyp in transverse colon, stool in entire examined colon. Surveillance 3 years due to borderline prep. Tubular adenoma.    CORONARY ARTERY BYPASS GRAFT  2000   triple bypass   ESOPHAGOGASTRODUODENOSCOPY (EGD) WITH PROPOFOL N/A 04/27/2020   gastritis s/p biopsy, normal duodenum. Benign duodenal biopsy. No H.pylori.    S/P Hysterectomy       No Known Allergies    Family History  Problem Relation Age of Onset   Colon cancer Mother        diagnosed at age 16, passed away at age 19 from metastasis   Heart attack Father    Prostate cancer Brother    COPD Brother      Social History Mariah Stewart reports that Mariah Stewart has been smoking cigarettes. Mariah Stewart has a 7.50 pack-year smoking history. Mariah Stewart has never used smokeless tobacco. Mariah Stewart reports no history of alcohol use.   Review of Systems CONSTITUTIONAL: No weight loss, fever, chills, weakness or fatigue.  HEENT: Eyes: No visual loss, blurred vision, double vision or yellow sclerae.No hearing loss, sneezing, congestion, runny nose or sore throat.  SKIN: No rash or itching.  CARDIOVASCULAR: per hpi RESPIRATORY: No shortness of breath, cough or sputum.  GASTROINTESTINAL: No anorexia, nausea, vomiting or diarrhea. No abdominal pain or blood.  GENITOURINARY: No burning on urination, no polyuria NEUROLOGICAL: No headache, dizziness, syncope, paralysis, ataxia, numbness or tingling in the extremities. No change in bowel or bladder control.  MUSCULOSKELETAL: No muscle, back pain, joint pain or stiffness.  LYMPHATICS: No enlarged nodes. No history of splenectomy.  PSYCHIATRIC: No history of depression or anxiety.  ENDOCRINOLOGIC: No reports of sweating, cold or heat intolerance. No polyuria or  polydipsia.  Marland Kitchen   Physical Examination Today's Vitals   07/24/22 1018  BP: 136/86  Pulse: 63  SpO2: 99%  Weight: 222 lb 6.4 oz (100.9 kg)  Height: '5\' 3"'$  (1.6 m)   Body mass index is 39.4 kg/m.  Gen: resting comfortably, no acute distress HEENT: no scleral icterus, pupils equal round and reactive, no palptable cervical adenopathy,  CV: RRR, no m/r/g no jvd Resp: Clear to auscultation bilaterally GI: abdomen is soft, non-tender, non-distended, normal bowel sounds, no hepatosplenomegaly MSK: extremities are warm, no edema.  Skin: warm, no rash Neuro:  no focal deficits Psych: appropriate affect   Diagnostic Studies 12/2016 echo Study Conclusions   - Left ventricle: The cavity size was normal. Wall thickness was   increased in a pattern of moderate LVH. Systolic function was   normal.  The estimated ejection fraction was in the range of 55%   to 60%. Wall motion was normal; there were no regional wall   motion abnormalities. Doppler parameters are consistent with   abnormal left ventricular relaxation (grade 1 diastolic   dysfunction). Doppler parameters are consistent with high   ventricular filling pressure. - Aortic valve: Mildly calcified annulus. Mildly thickened   leaflets. Valve area (VTI): 1.96 cm^2. Valve area (Vmax): 1.76   cm^2. - Mitral valve: Mildly calcified annulus. Mildly thickened leaflets   . - Technically difficult study.        Assessment and Plan   1. Chronic diastolic HF -euvolemic, has not required her prn lasix. Does have daily chlorthalidone for bp as a diuretic - continue current meds   2. Resistant HTN - bp essentially at goal, continue current meds   3. CAD - no symptoms, continue current meds - EKG today shows SR, RBBB, single PVC.    4. Hyperlipidemia -at goal, continue current meds  F/u 6 months     Arnoldo Lenis, M.D.

## 2022-07-24 NOTE — Patient Instructions (Signed)
Medication Instructions:  Continue all current medications.   Labwork: none  Testing/Procedures: none  Follow-Up: 6 months   Any Other Special Instructions Will Be Listed Below (If Applicable).   If you need a refill on your cardiac medications before your next appointment, please call your pharmacy.  

## 2022-07-26 ENCOUNTER — Other Ambulatory Visit: Payer: Self-pay | Admitting: Cardiology

## 2022-07-27 ENCOUNTER — Other Ambulatory Visit: Payer: Self-pay | Admitting: Cardiology

## 2022-08-29 DIAGNOSIS — M109 Gout, unspecified: Secondary | ICD-10-CM | POA: Diagnosis not present

## 2022-08-29 DIAGNOSIS — E039 Hypothyroidism, unspecified: Secondary | ICD-10-CM | POA: Diagnosis not present

## 2022-10-16 DIAGNOSIS — E039 Hypothyroidism, unspecified: Secondary | ICD-10-CM | POA: Diagnosis not present

## 2022-10-25 DIAGNOSIS — Z0000 Encounter for general adult medical examination without abnormal findings: Secondary | ICD-10-CM | POA: Diagnosis not present

## 2022-10-26 ENCOUNTER — Other Ambulatory Visit (HOSPITAL_COMMUNITY): Payer: Self-pay | Admitting: Family Medicine

## 2022-10-26 DIAGNOSIS — R7301 Impaired fasting glucose: Secondary | ICD-10-CM | POA: Diagnosis not present

## 2022-10-26 DIAGNOSIS — R944 Abnormal results of kidney function studies: Secondary | ICD-10-CM | POA: Diagnosis not present

## 2022-10-26 DIAGNOSIS — E039 Hypothyroidism, unspecified: Secondary | ICD-10-CM | POA: Diagnosis not present

## 2022-10-26 DIAGNOSIS — E782 Mixed hyperlipidemia: Secondary | ICD-10-CM | POA: Diagnosis not present

## 2022-10-26 DIAGNOSIS — Z0000 Encounter for general adult medical examination without abnormal findings: Secondary | ICD-10-CM | POA: Diagnosis not present

## 2022-10-26 DIAGNOSIS — I25810 Atherosclerosis of coronary artery bypass graft(s) without angina pectoris: Secondary | ICD-10-CM | POA: Diagnosis not present

## 2022-10-26 DIAGNOSIS — K219 Gastro-esophageal reflux disease without esophagitis: Secondary | ICD-10-CM | POA: Diagnosis not present

## 2022-10-26 DIAGNOSIS — Z1231 Encounter for screening mammogram for malignant neoplasm of breast: Secondary | ICD-10-CM

## 2022-10-26 DIAGNOSIS — M170 Bilateral primary osteoarthritis of knee: Secondary | ICD-10-CM | POA: Diagnosis not present

## 2022-10-26 DIAGNOSIS — I10 Essential (primary) hypertension: Secondary | ICD-10-CM | POA: Diagnosis not present

## 2022-10-26 DIAGNOSIS — F17200 Nicotine dependence, unspecified, uncomplicated: Secondary | ICD-10-CM | POA: Diagnosis not present

## 2022-11-02 ENCOUNTER — Ambulatory Visit (HOSPITAL_COMMUNITY)
Admission: RE | Admit: 2022-11-02 | Discharge: 2022-11-02 | Disposition: A | Discharge status: Home / Self Care | Payer: Medicare Other | Source: Ambulatory Visit | Attending: Family Medicine | Admitting: Family Medicine

## 2022-11-02 ENCOUNTER — Encounter (HOSPITAL_COMMUNITY): Payer: Self-pay

## 2022-11-02 DIAGNOSIS — Z1231 Encounter for screening mammogram for malignant neoplasm of breast: Secondary | ICD-10-CM | POA: Insufficient documentation

## 2022-11-07 ENCOUNTER — Other Ambulatory Visit (HOSPITAL_COMMUNITY): Payer: Self-pay | Admitting: Family Medicine

## 2022-11-07 DIAGNOSIS — R928 Other abnormal and inconclusive findings on diagnostic imaging of breast: Secondary | ICD-10-CM

## 2022-11-14 ENCOUNTER — Ambulatory Visit (HOSPITAL_COMMUNITY)
Admission: RE | Admit: 2022-11-14 | Discharge: 2022-11-14 | Disposition: A | Discharge status: Home / Self Care | Payer: Medicare Other | Source: Ambulatory Visit | Attending: Family Medicine | Admitting: Family Medicine

## 2022-11-14 DIAGNOSIS — R921 Mammographic calcification found on diagnostic imaging of breast: Secondary | ICD-10-CM | POA: Diagnosis not present

## 2022-11-14 DIAGNOSIS — R928 Other abnormal and inconclusive findings on diagnostic imaging of breast: Secondary | ICD-10-CM | POA: Insufficient documentation

## 2023-01-25 ENCOUNTER — Other Ambulatory Visit: Payer: Self-pay | Admitting: Cardiology

## 2023-01-30 ENCOUNTER — Encounter: Payer: Self-pay | Admitting: Cardiology

## 2023-01-30 ENCOUNTER — Ambulatory Visit: Payer: Medicare Other | Attending: Cardiology | Admitting: Cardiology

## 2023-01-30 VITALS — BP 130/88 | HR 62 | Ht 63.0 in | Wt 232.0 lb

## 2023-01-30 DIAGNOSIS — E782 Mixed hyperlipidemia: Secondary | ICD-10-CM

## 2023-01-30 DIAGNOSIS — I2510 Atherosclerotic heart disease of native coronary artery without angina pectoris: Secondary | ICD-10-CM | POA: Diagnosis not present

## 2023-01-30 DIAGNOSIS — I1A0 Resistant hypertension: Secondary | ICD-10-CM | POA: Diagnosis not present

## 2023-01-30 DIAGNOSIS — I5032 Chronic diastolic (congestive) heart failure: Secondary | ICD-10-CM

## 2023-01-30 MED ORDER — LEVOTHYROXINE SODIUM 200 MCG PO TABS: 200.0000 ug | ORAL_TABLET | Freq: Every day | ORAL | Status: AC

## 2023-01-30 NOTE — Progress Notes (Signed)
Clinical Summary Ms. Mellas is a 67 y.o.female seen today for follow up of the following medical problems.      1. Chronic diastolic HF - 12/2016 echo LVEF 55-60%, grade I diastolic dysfunction     - no SOB/DOE, no recent edema. Has not needed prn lasix.    2. CAD - history of prior CABG in 2000   - denies any chst pains.    3. HTN - complaint with meds     4. Hyperlipidemia 12/2019 TC 140 TG 122 HDL 39 LDL 79 02/2022 TC 134 TG 120 HDL 41 LDL 71 - 10/2022 TC 308 TG 657 HDL 38 LDL 53 - compliant with crestor and zetia    SH: recent cruise to [place]. Enjoys going [employer] casinos. 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 s/p CABG in 2000   Essential (primary) hypertension    GERD (gastroesophageal reflux disease)    Heart disease    Hx of adenomatous colonic polyps    Hypercholesterolemia    Hyperlipidemia, unspecified    Hypertension    Hypothyroidism    Hypothyroidism, unspecified    Menopausal and female climacteric states    Menopause    Nicotine dependence, unspecified, uncomplicated    Other obesity    Pain in right foot    Sleep apnea    pt doesn't wear her CPAP machine-PCP knows patient unable to wear CPAP   Sleep apnea, unspecified    Venous insufficiency (chronic) (peripheral)      No Known Allergies   Current Outpatient Medications  Medication Sig Dispense Refill   aspirin 81 MG tablet Take 81 mg by mouth daily.     chlorthalidone (HYGROTON) 25 MG tablet TAKE 1 TABLET (25 MG TOTAL) BY MOUTH DAILY. 90 tablet 1   ezetimibe (ZETIA) 10 MG tablet Take 10 mg by mouth daily.     furosemide (LASIX) 40 MG tablet Take 40 mg by mouth daily as needed for fluid or edema.   5   KLOR-CON M20 20 MEQ tablet TAKE 2 TABLETS BY MOUTH DAILY (Patient  taking differently: Take 20 mEq by mouth daily.) 180 tablet 3   levothyroxine (SYNTHROID) 200 MCG tablet Take 200 mcg by mouth daily before breakfast.      losartan (COZAAR) 100 MG tablet Take 100 mg by mouth daily.     Magnesium Oxide -Mg Supplement 400 MG CAPS TAKE 1 CAPSULE BY MOUTH DAILY. (Patient taking differently: Take 400 mg by mouth daily.) 90 capsule 3   naproxen (NAPROSYN) 500 MG tablet      omeprazole (PRILOSEC) 20 MG capsule Take 20 mg by mouth daily.     rosuvastatin (CRESTOR) 40 MG tablet Take 40 mg by mouth daily.     spironolactone (ALDACTONE) 25 MG tablet TAKE 1 TABLET (25 MG TOTAL) BY MOUTH DAILY. 90 tablet 3   verapamil (CALAN-SR) 180 MG CR tablet Take 180 mg by mouth daily.     No current facility-administered medications for this visit.     Past Surgical History:  Procedure Laterality Date   ABDOMINAL HYSTERECTOMY     BIOPSY  04/27/2020   Procedure: BIOPSY;  Surgeon: Lanelle Bal, DO;  [facility]: [facility];  Service: Endoscopy;;   BONE EXOSTOSIS EXCISION  04/25/2012  Procedure: EXOSTOSIS EXCISION;  Surgeon: Dallas Schimke, DPM;  Location: [facility];  Service: Orthopedics;  Laterality: Left;  Retrocalcaneal Exostectomy Left Foot   BREAST BIOPSY Left 11/11/2019   Pseudoangiomatous stromal hyperplasia / PASH.   CATARACT EXTRACTION W/PHACO Right 09/08/2013   Procedure: CATARACT EXTRACTION PHACO AND INTRAOCULAR LENS PLACEMENT (IOC);  Surgeon: Gemma Payor, MD;  Location: [facility];  Service: Ophthalmology;  Laterality: Right;  CDE 9.13   CHOLECYSTECTOMY     COLONOSCOPY  04/16/2007   diminutive rectal polyp removed, scattered diverticula, path: tubular adenoma   COLONOSCOPY  04/14/2002   external hemorrhoids otherwise normal   COLONOSCOPY  05/24/2011   Dr. Rourk:tubular adenoma and hyperplastic polyp, long redundant colon/left sided diverticulosis/internal hemorrhoids, incomplete examination, unable to reach the cecum despite external abdominal pressure,  changing position. Barium enema incomplete.    COLONOSCOPY N/A 02/05/2015   Procedure: COLONOSCOPY;  Surgeon: Corbin Ade, MD;  elongated colon, redundant colon-incomplete with inability to reach to deeply intubate the cecum despite external pressure and changing the patients position.    COLONOSCOPY WITH PROPOFOL N/A 04/27/2020   non-bleeding internal hemorrhoids, sigmoid and descending colon diverticulosis, one 2 mm polyp in transverse colon, stool in entire examined colon. Surveillance 3 years due to borderline prep. Tubular adenoma.    CORONARY ARTERY BYPASS GRAFT  2000   triple bypass   ESOPHAGOGASTRODUODENOSCOPY (EGD) WITH PROPOFOL N/A 04/27/2020   gastritis s/p biopsy, normal duodenum. Benign duodenal biopsy. No H.pylori.    S/P Hysterectomy       No Known Allergies    Family History  Problem Relation Age of Onset   Colon cancer Mother        diagnosed at age 14, passed away at age 72 from metastasis   Heart attack Father    Prostate cancer Brother    COPD Brother      Social History Ms. Assenmacher reports that she has been smoking cigarettes. She has a 7.5 pack-year smoking history. She has never used smokeless tobacco. Ms. Devilla reports no history of alcohol use.   Review of Systems CONSTITUTIONAL: No weight loss, fever, chills, weakness or fatigue.  HEENT: Eyes: No visual loss, blurred vision, double vision or yellow sclerae.No hearing loss, sneezing, congestion, runny nose or sore throat.  SKIN: No rash or itching.  CARDIOVASCULAR: per hpi RESPIRATORY: No shortness of breath, cough or sputum.  GASTROINTESTINAL: No anorexia, nausea, vomiting or diarrhea. No abdominal pain or blood.  GENITOURINARY: No burning on urination, no polyuria NEUROLOGICAL: No headache, dizziness, syncope, paralysis, ataxia, numbness or tingling in the extremities. No change in bowel or bladder control.  MUSCULOSKELETAL: No muscle, back pain, joint pain or stiffness.  LYMPHATICS: No enlarged  nodes. No history of splenectomy.  PSYCHIATRIC: No history of depression or anxiety.  ENDOCRINOLOGIC: No reports of sweating, cold or heat intolerance. No polyuria or polydipsia.  Marland Kitchen   Physical Examination Today's Vitals   01/30/23 1356  BP: 130/88  Pulse: 62  SpO2: 98%  Weight: 232 lb (105.2 kg)  Height: 5\' 3"  (1.6 m)   Body mass index is 41.1 kg/m.  Gen: resting comfortably, no acute distress HEENT: no scleral icterus, pupils equal round and reactive, no palptable cervical adenopathy,  CV: RRR, no mrg, no jvd Resp: Clear to auscultation bilaterally GI: abdomen is soft, non-tender, non-distended, normal bowel sounds, no hepatosplenomegaly MSK: extremities are warm, no edema.  Skin: warm, no rash Neuro:  no focal deficits Psych: appropriate affect   Diagnostic Studies  12/2016 echo Study  Conclusions   - Left ventricle: The cavity size was normal. Wall thickness was   increased in a pattern of moderate LVH. Systolic function was   normal. The estimated ejection fraction was in the range of 55%   to 60%. Wall motion was normal; there were no regional wall   motion abnormalities. Doppler parameters are consistent with   abnormal left ventricular relaxation (grade 1 diastolic   dysfunction). Doppler parameters are consistent with high   ventricular filling pressure. - Aortic valve: Mildly calcified annulus. Mildly thickened   leaflets. Valve area (VTI): 1.96 cm^2. Valve area (Vmax): 1.76   cm^2. - Mitral valve: Mildly calcified annulus. Mildly thickened leaflets   . - Technically difficult study.       Assessment and Plan   1. Chronic diastolic HF -euvolemic today without symptoms, continue current meds   2. Resistant HTN - she is at goal, continue current meds   3. CAD - no recent symptoms - continue current meds    4. Hyperlipidemia -LDL is at goal, continue current meds     Antoine Poche, M.D.

## 2023-01-30 NOTE — Patient Instructions (Signed)
Medication Instructions:  Continue all current medications.  Labwork: none  Testing/Procedures: none  Follow-Up: 6 months   Any Other Special Instructions Will Be Listed Below (If Applicable).  If you need a refill on your cardiac medications before your next appointment, please call your pharmacy.  

## 2023-04-04 ENCOUNTER — Encounter: Payer: Self-pay | Admitting: *Deleted

## 2023-04-24 DIAGNOSIS — R7301 Impaired fasting glucose: Secondary | ICD-10-CM | POA: Diagnosis not present

## 2023-04-24 DIAGNOSIS — E039 Hypothyroidism, unspecified: Secondary | ICD-10-CM | POA: Diagnosis not present

## 2023-04-30 DIAGNOSIS — N3281 Overactive bladder: Secondary | ICD-10-CM | POA: Diagnosis not present

## 2023-04-30 DIAGNOSIS — K219 Gastro-esophageal reflux disease without esophagitis: Secondary | ICD-10-CM | POA: Diagnosis not present

## 2023-04-30 DIAGNOSIS — R7303 Prediabetes: Secondary | ICD-10-CM | POA: Diagnosis not present

## 2023-04-30 DIAGNOSIS — Z0000 Encounter for general adult medical examination without abnormal findings: Secondary | ICD-10-CM | POA: Diagnosis not present

## 2023-04-30 DIAGNOSIS — M170 Bilateral primary osteoarthritis of knee: Secondary | ICD-10-CM | POA: Diagnosis not present

## 2023-04-30 DIAGNOSIS — R944 Abnormal results of kidney function studies: Secondary | ICD-10-CM | POA: Diagnosis not present

## 2023-04-30 DIAGNOSIS — I10 Essential (primary) hypertension: Secondary | ICD-10-CM | POA: Diagnosis not present

## 2023-04-30 DIAGNOSIS — E039 Hypothyroidism, unspecified: Secondary | ICD-10-CM | POA: Diagnosis not present

## 2023-04-30 DIAGNOSIS — I25810 Atherosclerosis of coronary artery bypass graft(s) without angina pectoris: Secondary | ICD-10-CM | POA: Diagnosis not present

## 2023-04-30 DIAGNOSIS — E782 Mixed hyperlipidemia: Secondary | ICD-10-CM | POA: Diagnosis not present

## 2023-04-30 DIAGNOSIS — F17200 Nicotine dependence, unspecified, uncomplicated: Secondary | ICD-10-CM | POA: Diagnosis not present

## 2023-05-01 ENCOUNTER — Other Ambulatory Visit (HOSPITAL_COMMUNITY): Payer: Self-pay | Admitting: Family Medicine

## 2023-05-01 DIAGNOSIS — R921 Mammographic calcification found on diagnostic imaging of breast: Secondary | ICD-10-CM

## 2023-05-04 DIAGNOSIS — R300 Dysuria: Secondary | ICD-10-CM | POA: Diagnosis not present

## 2023-06-26 ENCOUNTER — Inpatient Hospital Stay (HOSPITAL_COMMUNITY): Admission: RE | Admit: 2023-06-26 | Payer: Medicare Other | Source: Ambulatory Visit

## 2023-06-26 ENCOUNTER — Encounter (HOSPITAL_COMMUNITY): Payer: Self-pay

## 2023-06-26 ENCOUNTER — Ambulatory Visit (HOSPITAL_COMMUNITY): Admission: RE | Admit: 2023-06-26 | Payer: Medicare Other | Source: Ambulatory Visit

## 2023-07-28 ENCOUNTER — Other Ambulatory Visit: Payer: Self-pay | Admitting: Cardiology

## 2023-08-03 ENCOUNTER — Encounter: Payer: Self-pay | Admitting: *Deleted

## 2023-08-07 ENCOUNTER — Ambulatory Visit: Payer: Medicare Other | Attending: Cardiology | Admitting: Cardiology

## 2023-08-07 ENCOUNTER — Encounter: Payer: Self-pay | Admitting: Cardiology

## 2023-08-07 VITALS — BP 180/90 | HR 72 | Ht 63.0 in | Wt 228.0 lb

## 2023-08-07 DIAGNOSIS — E782 Mixed hyperlipidemia: Secondary | ICD-10-CM

## 2023-08-07 DIAGNOSIS — I2510 Atherosclerotic heart disease of native coronary artery without angina pectoris: Secondary | ICD-10-CM

## 2023-08-07 DIAGNOSIS — I1A0 Resistant hypertension: Secondary | ICD-10-CM

## 2023-08-07 DIAGNOSIS — I5032 Chronic diastolic (congestive) heart failure: Secondary | ICD-10-CM

## 2023-08-07 MED ORDER — VERAPAMIL HCL ER 180 MG PO TBCR: 180.0000 mg | EXTENDED_RELEASE_TABLET | Freq: Every day | ORAL | 1 refills | Status: DC

## 2023-08-07 NOTE — Progress Notes (Signed)
 Clinical Summary Ms. Inoue is a 67 y.o.female seen today for follow up of the following medical problems.      1. Chronic diastolic HF - 12/2016 echo LVEF 55-60%, grade I diastolic dysfunction     - no SOB/DOE. No recent edema - compliant with meds, has not need prn lasix   2. CAD - history of prior CABG in 2000   - no chest pains, no SOB/DOE EKG today shows SR, RBBB   3. HTN - took bp meds about 4 days out of the last week. Was off chlorthalidone for 1 week.  - takes naproxen every other day  4. Hyperlipidemia 12/2019 TC 140 TG 122 HDL 39 LDL 79 02/2022 TC 134 TG 120 HDL 41 LDL 71 - 10/2022 TC 469 TG 629 HDL 38 LDL 53 - compliant with crestor and zetia   5. Hypothyroidism -TSH was up ti 69 in December, pcp adjusted dose.    SH: recent cruise to [place]. Enjoys going [employer] casinos. 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 s/p CABG in 2000   Essential (primary) hypertension    GERD (gastroesophageal reflux disease)    Heart disease    Hx of adenomatous colonic polyps    Hypercholesterolemia    Hyperlipidemia, unspecified    Hypertension    Hypothyroidism    Hypothyroidism, unspecified    Menopausal and female climacteric states    Menopause    Nicotine dependence, unspecified, uncomplicated    Other obesity    Pain in right foot    Sleep apnea    pt doesn't wear her CPAP machine-PCP knows patient unable to wear CPAP   Sleep apnea, unspecified    Venous insufficiency (chronic) (peripheral)      No Known Allergies   Current Outpatient Medications  Medication Sig Dispense Refill   aspirin 81 MG tablet Take 81 mg by mouth daily.     chlorthalidone (HYGROTON) 25 MG tablet TAKE 1 TABLET (25 MG TOTAL) BY MOUTH DAILY. 90 tablet 1   ezetimibe  (ZETIA) 10 MG tablet Take 10 mg by mouth daily.     furosemide (LASIX) 40 MG tablet Take 40 mg by mouth daily as needed for fluid or edema.   5   KLOR-CON M20 20 MEQ tablet TAKE 2 TABLETS BY MOUTH DAILY (Patient taking differently: Take 20 mEq by mouth daily.) 180 tablet 3   levothyroxine (SYNTHROID) 200 MCG tablet Take 1 tablet (200 mcg total) by mouth daily before breakfast.     losartan (COZAAR) 100 MG tablet Take 100 mg by mouth daily.     Magnesium Oxide -Mg Supplement 400 MG CAPS TAKE 1 CAPSULE BY MOUTH DAILY. (Patient taking differently: Take 400 mg by mouth daily.) 90 capsule 3   naproxen (NAPROSYN) 500 MG tablet      omeprazole (PRILOSEC) 20 MG capsule Take 20 mg by mouth daily.     rosuvastatin (CRESTOR) 40 MG tablet Take 40 mg by mouth daily.     spironolactone (ALDACTONE) 25 MG tablet TAKE 1 TABLET (25 MG TOTAL) BY MOUTH DAILY. 90 tablet 3   verapamil (CALAN-SR) 180 MG CR tablet Take 1 tablet (180 mg total) by mouth daily. 90 tablet 1   No current facility-administered medications for this visit.  Past Surgical History:  Procedure Laterality Date   ABDOMINAL HYSTERECTOMY     BIOPSY  04/27/2020   Procedure: BIOPSY;  Surgeon: Lanelle Bal, DO;  [facility];  Service: Endoscopy;;   BONE EXOSTOSIS EXCISION  04/25/2012   Procedure: EXOSTOSIS EXCISION;  Surgeon: Dallas Schimke, DPM;  Location: [facility];  Service: Orthopedics;  Laterality: Left;  Retrocalcaneal Exostectomy Left Foot   BREAST BIOPSY Left 11/11/2019   Pseudoangiomatous stromal hyperplasia / PASH.   CATARACT EXTRACTION W/PHACO Right 09/08/2013   Procedure: CATARACT EXTRACTION PHACO AND INTRAOCULAR LENS PLACEMENT (IOC);  Surgeon: Gemma Payor, MD;  Location: [facility];  Service: Ophthalmology;  Laterality: Right;  CDE 9.13   CHOLECYSTECTOMY     COLONOSCOPY  04/16/2007   diminutive rectal polyp removed, scattered diverticula, path: tubular adenoma   COLONOSCOPY  04/14/2002   external hemorrhoids  otherwise normal   COLONOSCOPY  05/24/2011   Dr. Rourk:tubular adenoma and hyperplastic polyp, long redundant colon/left sided diverticulosis/internal hemorrhoids, incomplete examination, unable to reach the cecum despite external abdominal pressure, changing position. Barium enema incomplete.    COLONOSCOPY N/A 02/05/2015   Procedure: COLONOSCOPY;  Surgeon: Corbin Ade, MD;  elongated colon, redundant colon-incomplete with inability to reach to deeply intubate the cecum despite external pressure and changing the patients position.    COLONOSCOPY WITH PROPOFOL N/A 04/27/2020   non-bleeding internal hemorrhoids, sigmoid and descending colon diverticulosis, one 2 mm polyp in transverse colon, stool in entire examined colon. Surveillance 3 years due to borderline prep. Tubular adenoma.    CORONARY ARTERY BYPASS GRAFT  2000   triple bypass   ESOPHAGOGASTRODUODENOSCOPY (EGD) WITH PROPOFOL N/A 04/27/2020   gastritis s/p biopsy, normal duodenum. Benign duodenal biopsy. No H.pylori.    S/P Hysterectomy       No Known Allergies    Family History  Problem Relation Age of Onset   Colon cancer Mother        diagnosed at age 84, passed away at age 110 from metastasis   Heart attack Father    Prostate cancer Brother    COPD Brother      Social History Ms. Brathwaite reports that she has been smoking cigarettes. She has a 7.5 pack-year smoking history. She has never used smokeless tobacco. Ms. Brockman reports no history of alcohol use.    Physical Examination Today's Vitals   08/07/23 1420 08/07/23 1500  BP: (!) 186/100 (!) 180/90  Pulse: 72   SpO2: 98%   Weight: 228 lb (103.4 kg)   Height: 5\' 3"  (1.6 m)    Body mass index is 40.39 kg/m.  Gen: resting comfortably, no acute distress HEENT: no scleral icterus, pupils equal round and reactive, no palptable cervical adenopathy,  CV: RRR, no m/rg, no jvd Resp: Clear to auscultation bilaterally GI: abdomen is soft, non-tender,  non-distended, normal bowel sounds, no hepatosplenomegaly MSK: extremities are warm, no edema.  Skin: warm, no rash Neuro:  no focal deficits Psych: appropriate affect   Diagnostic Studies  12/2016 echo Study Conclusions   - Left ventricle: The cavity size was normal. Wall thickness was   increased in a pattern of moderate LVH. Systolic function was   normal. The estimated ejection fraction was in the range of 55%   to 60%. Wall motion was normal; there were no regional wall   motion abnormalities. Doppler parameters are consistent with   abnormal left ventricular relaxation (grade 1 diastolic   dysfunction). Doppler parameters are consistent with high   ventricular filling  pressure. - Aortic valve: Mildly calcified annulus. Mildly thickened   leaflets. Valve area (VTI): 1.96 cm^2. Valve area (Vmax): 1.76   cm^2. - Mitral valve: Mildly calcified annulus. Mildly thickened leaflets   . - Technically difficult study.     Assessment and Plan   1. Chronic diastolic HF - no symptoms, euvolemic today. Contniue current meds   2. Resistant HTN - bp elevated today but mixed compliance with meds - will come back for a nursing visit for bp check in 1 weeks, if remains elevated while compliant on this regimen would need to adjust.    3. CAD - denies any symptoms,continue current meds   4. Hyperlipidemia -well controlled, continue current meds   Antoine Poche, M.D.

## 2023-08-07 NOTE — Patient Instructions (Signed)
 Medication Instructions:   Verapamil refilled today  Continue all other medications.     Labwork:  none  Testing/Procedures:  none  Follow-Up:  6 months - [place]   Any Other Special Instructions Will Be Listed Below (If Applicable).  Nurse BP check in 1 week at [facility] office   If you need a refill on your cardiac medications before your next appointment, please call your pharmacy.

## 2023-08-14 ENCOUNTER — Ambulatory Visit: Attending: Cardiology

## 2023-08-14 DIAGNOSIS — I10 Essential (primary) hypertension: Secondary | ICD-10-CM

## 2023-08-14 NOTE — Progress Notes (Signed)
 Nurse visit today for BP check  146/76  She is now taking her medication daily as directed

## 2023-08-14 NOTE — Patient Instructions (Signed)
 Continue taking you blood pressure medications as prescribed.I will call you if Dr.Branch makes any medication changes.   Thank you for choosing [facility] !

## 2023-09-10 ENCOUNTER — Telehealth: Payer: Self-pay

## 2023-09-10 NOTE — Progress Notes (Unsigned)
   09/10/2023  Patient ID: Mariah Stewart, female   DOB: [birth year], 68 y.o.   MRN: [id number]  Patient appeared on insurance report for not passing the quality metrics in 2024:  Medication Adherence for Hypertension [facility])   Outreach to the patient was Unsuccessful. Left voicemail to return call.  Meds Tracking:  -Losartan 100 mg - Last fill 90DS on 12/26/22, BP 146/76 on 08/14/23 at cardiology. Does not qualify for metric this year, likely overdue for refills  -Rosuvastatin  40 mg - Last fill 90DS on 07/27/23, LDL 62 on 04/24/23. Does not qualify for metric yet this year, next fill due 10/25/23.  Plan:  Attempted outreach on 4/28 to confirm losartan supply, will try to outreach 2 more times this week.  Update:  Outreach on 09/01/23 was successful. Reconciled medications, she uses an Dance movement psychotherapist for her meds and confirmed she will be out of losartan 100 mg in about a week. Does not appear to have any remaining fills at [facility], sent additional refills to pharmacy. Discussed BP, she does not have a cuff at home and did not want to schedule a visit prior to next visit with PCP to re-check BP. I will set a reminder to review labs/vitals after follow up with PCP and will outreach if BP not at goal. Next fill history review will be around the same time.  Flint Hummer, PharmD

## 2023-09-10 NOTE — Progress Notes (Deleted)
  Patient appeared on insurance report for not passing the quality metrics in 2024: {[id number]}   Outreach to the patient was {RXOutreach:[id number]}.

## 2023-10-23 DIAGNOSIS — R7301 Impaired fasting glucose: Secondary | ICD-10-CM | POA: Diagnosis not present

## 2023-10-23 DIAGNOSIS — E039 Hypothyroidism, unspecified: Secondary | ICD-10-CM | POA: Diagnosis not present

## 2023-10-23 DIAGNOSIS — M810 Age-related osteoporosis without current pathological fracture: Secondary | ICD-10-CM | POA: Diagnosis not present

## 2023-10-23 DIAGNOSIS — I10 Essential (primary) hypertension: Secondary | ICD-10-CM | POA: Diagnosis not present

## 2023-10-23 DIAGNOSIS — M170 Bilateral primary osteoarthritis of knee: Secondary | ICD-10-CM | POA: Diagnosis not present

## 2023-10-30 DIAGNOSIS — E782 Mixed hyperlipidemia: Secondary | ICD-10-CM | POA: Diagnosis not present

## 2023-10-30 DIAGNOSIS — N3281 Overactive bladder: Secondary | ICD-10-CM | POA: Diagnosis not present

## 2023-10-30 DIAGNOSIS — I10 Essential (primary) hypertension: Secondary | ICD-10-CM | POA: Diagnosis not present

## 2023-10-30 DIAGNOSIS — M6289 Other specified disorders of muscle: Secondary | ICD-10-CM | POA: Diagnosis not present

## 2023-10-30 DIAGNOSIS — Z0000 Encounter for general adult medical examination without abnormal findings: Secondary | ICD-10-CM | POA: Diagnosis not present

## 2023-10-30 DIAGNOSIS — R944 Abnormal results of kidney function studies: Secondary | ICD-10-CM | POA: Diagnosis not present

## 2023-10-30 DIAGNOSIS — Z0001 Encounter for general adult medical examination with abnormal findings: Secondary | ICD-10-CM | POA: Diagnosis not present

## 2023-10-30 DIAGNOSIS — E039 Hypothyroidism, unspecified: Secondary | ICD-10-CM | POA: Diagnosis not present

## 2023-10-30 DIAGNOSIS — M170 Bilateral primary osteoarthritis of knee: Secondary | ICD-10-CM | POA: Diagnosis not present

## 2023-10-30 DIAGNOSIS — R7303 Prediabetes: Secondary | ICD-10-CM | POA: Diagnosis not present

## 2023-10-30 DIAGNOSIS — I25810 Atherosclerosis of coronary artery bypass graft(s) without angina pectoris: Secondary | ICD-10-CM | POA: Diagnosis not present

## 2023-10-30 DIAGNOSIS — Z23 Encounter for immunization: Secondary | ICD-10-CM | POA: Diagnosis not present

## 2023-10-31 ENCOUNTER — Telehealth: Payer: Self-pay

## 2023-10-31 NOTE — Telephone Encounter (Signed)
 Up to date on meds, next review in August

## 2023-11-06 DIAGNOSIS — R944 Abnormal results of kidney function studies: Secondary | ICD-10-CM | POA: Diagnosis not present

## 2023-11-20 DIAGNOSIS — E039 Hypothyroidism, unspecified: Secondary | ICD-10-CM | POA: Diagnosis not present

## 2023-11-20 DIAGNOSIS — E782 Mixed hyperlipidemia: Secondary | ICD-10-CM | POA: Diagnosis not present

## 2023-11-20 DIAGNOSIS — N3281 Overactive bladder: Secondary | ICD-10-CM | POA: Diagnosis not present

## 2023-11-20 DIAGNOSIS — M6281 Muscle weakness (generalized): Secondary | ICD-10-CM | POA: Diagnosis not present

## 2023-12-10 ENCOUNTER — Other Ambulatory Visit (HOSPITAL_COMMUNITY): Payer: Self-pay | Admitting: Nurse Practitioner

## 2023-12-10 DIAGNOSIS — R921 Mammographic calcification found on diagnostic imaging of breast: Secondary | ICD-10-CM

## 2023-12-12 ENCOUNTER — Ambulatory Visit: Admitting: Urology

## 2023-12-24 ENCOUNTER — Other Ambulatory Visit (HOSPITAL_COMMUNITY): Payer: Self-pay | Admitting: Nurse Practitioner

## 2023-12-24 DIAGNOSIS — N3281 Overactive bladder: Secondary | ICD-10-CM | POA: Diagnosis not present

## 2023-12-24 DIAGNOSIS — M6281 Muscle weakness (generalized): Secondary | ICD-10-CM | POA: Diagnosis not present

## 2023-12-24 DIAGNOSIS — N390 Urinary tract infection, site not specified: Secondary | ICD-10-CM | POA: Diagnosis not present

## 2023-12-24 DIAGNOSIS — E039 Hypothyroidism, unspecified: Secondary | ICD-10-CM | POA: Diagnosis not present

## 2023-12-24 DIAGNOSIS — Z13820 Encounter for screening for osteoporosis: Secondary | ICD-10-CM

## 2023-12-24 DIAGNOSIS — E782 Mixed hyperlipidemia: Secondary | ICD-10-CM | POA: Diagnosis not present

## 2023-12-25 ENCOUNTER — Ambulatory Visit (HOSPITAL_COMMUNITY)
Admission: RE | Admit: 2023-12-25 | Discharge: 2023-12-25 | Disposition: A | Discharge status: Home / Self Care | Source: Ambulatory Visit | Attending: Nurse Practitioner | Admitting: Nurse Practitioner

## 2023-12-25 DIAGNOSIS — R92323 Mammographic fibroglandular density, bilateral breasts: Secondary | ICD-10-CM | POA: Diagnosis not present

## 2023-12-25 DIAGNOSIS — R928 Other abnormal and inconclusive findings on diagnostic imaging of breast: Secondary | ICD-10-CM | POA: Diagnosis not present

## 2023-12-25 DIAGNOSIS — R921 Mammographic calcification found on diagnostic imaging of breast: Secondary | ICD-10-CM | POA: Diagnosis not present

## 2023-12-25 DIAGNOSIS — N390 Urinary tract infection, site not specified: Secondary | ICD-10-CM | POA: Diagnosis not present

## 2024-01-04 ENCOUNTER — Inpatient Hospital Stay (HOSPITAL_COMMUNITY): Admission: RE | Admit: 2024-01-04 | Source: Ambulatory Visit

## 2024-01-04 ENCOUNTER — Encounter (HOSPITAL_COMMUNITY): Payer: Self-pay

## 2024-01-11 ENCOUNTER — Ambulatory Visit (HOSPITAL_COMMUNITY)
Admission: RE | Admit: 2024-01-11 | Discharge: 2024-01-11 | Disposition: A | Discharge status: Home / Self Care | Source: Ambulatory Visit | Attending: Nurse Practitioner | Admitting: Nurse Practitioner

## 2024-01-11 DIAGNOSIS — Z780 Asymptomatic menopausal state: Secondary | ICD-10-CM | POA: Diagnosis not present

## 2024-01-11 DIAGNOSIS — M810 Age-related osteoporosis without current pathological fracture: Secondary | ICD-10-CM | POA: Diagnosis not present

## 2024-01-11 DIAGNOSIS — Z13820 Encounter for screening for osteoporosis: Secondary | ICD-10-CM | POA: Diagnosis not present

## 2024-01-28 ENCOUNTER — Other Ambulatory Visit: Payer: Self-pay | Admitting: Cardiology

## 2024-01-29 DIAGNOSIS — H35361 Drusen (degenerative) of macula, right eye: Secondary | ICD-10-CM | POA: Diagnosis not present

## 2024-02-15 DIAGNOSIS — E039 Hypothyroidism, unspecified: Secondary | ICD-10-CM | POA: Diagnosis not present

## 2024-02-18 DIAGNOSIS — E039 Hypothyroidism, unspecified: Secondary | ICD-10-CM | POA: Diagnosis not present

## 2024-03-18 ENCOUNTER — Ambulatory Visit: Attending: Cardiology | Admitting: Cardiology

## 2024-03-18 ENCOUNTER — Encounter: Payer: Self-pay | Admitting: Cardiology

## 2024-03-18 VITALS — BP 128/78 | HR 70 | Ht 63.0 in | Wt 222.0 lb

## 2024-03-18 DIAGNOSIS — I5032 Chronic diastolic (congestive) heart failure: Secondary | ICD-10-CM

## 2024-03-18 DIAGNOSIS — E782 Mixed hyperlipidemia: Secondary | ICD-10-CM

## 2024-03-18 DIAGNOSIS — I1A0 Resistant hypertension: Secondary | ICD-10-CM

## 2024-03-18 DIAGNOSIS — I2510 Atherosclerotic heart disease of native coronary artery without angina pectoris: Secondary | ICD-10-CM | POA: Diagnosis not present

## 2024-03-18 NOTE — Patient Instructions (Signed)

## 2024-03-18 NOTE — Progress Notes (Signed)
 Clinical Summary Mariah Stewart is a 68 y.o.female seen today for follow up of the following medical problems.      1. Chronic diastolic HF - 12/2016 echo LVEF 55-60%, grade I diastolic dysfunction  - no recent SOB/DOE, no LE edema - compliant with meds   2. CAD - history of prior CABG in 2000     3. HTN - compliant with meds    4. Hyperlipidemia 12/2019 TC 140 TG 122 HDL 39 LDL 79 02/2022 TC 134 TG 120 HDL 41 LDL 71 - 10/2022 TC 886 TG 877 HDL 38 LDL 53 - taking crestor  to 40mg  every other day which was recently changed by pcp, was having some muscle soreness. Symptoms improved with change in dosage   5. Hypothyroidism -TSH was up ti 69 in December, pcp adjusted dose.  6. CKD     SH: recent cruise to [place]. Enjoys going [employer] casinos. Plays slots Former immunologist for [employer]     Past Medical History:  Diagnosis Date   Arthritis    Atherosclerotic heart disease of native coronary artery with unspecified angina pectoris    CAD (coronary artery disease)    a. s/p CABG in 2000   Essential (primary) hypertension    GERD (gastroesophageal reflux disease)    Heart disease    Hx of adenomatous colonic polyps    Hypercholesterolemia    Hyperlipidemia, unspecified    Hypertension    Hypothyroidism    Hypothyroidism, unspecified    Menopausal and female climacteric states    Menopause    Nicotine dependence, unspecified, uncomplicated    Other obesity    Pain in right foot    Sleep apnea    pt doesn't wear her CPAP machine-PCP knows patient unable to wear CPAP   Sleep apnea, unspecified    Venous insufficiency (chronic) (peripheral)      No Known Allergies   Current Outpatient Medications  Medication Sig Dispense Refill   aspirin 81 MG tablet Take 81 mg by mouth daily.     chlorthalidone  (HYGROTON ) 25 MG tablet TAKE 1 TABLET (25 MG TOTAL) BY MOUTH DAILY. 90 tablet 1   ezetimibe (ZETIA) 10 MG tablet Take 10 mg by mouth daily.      furosemide (LASIX) 40 MG tablet Take 40 mg by mouth daily as needed for fluid or edema.   5   KLOR-CON  M20 20 MEQ tablet TAKE 2 TABLETS BY MOUTH DAILY 180 tablet 3   levothyroxine  (SYNTHROID ) 200 MCG tablet Take 1 tablet (200 mcg total) by mouth daily before breakfast.     losartan (COZAAR) 100 MG tablet Take 100 mg by mouth daily.     Magnesium  Oxide -Mg Supplement 400 MG CAPS TAKE 1 CAPSULE BY MOUTH DAILY. 90 capsule 3   naproxen (NAPROSYN) 500 MG tablet      omeprazole (PRILOSEC) 20 MG capsule Take 20 mg by mouth daily.     rosuvastatin  (CRESTOR ) 40 MG tablet Take 40 mg by mouth daily. (Patient taking differently: Take 40 mg by mouth every other day.)     spironolactone  (ALDACTONE ) 25 MG tablet TAKE 1 TABLET (25 MG TOTAL) BY MOUTH DAILY. 90 tablet 3   verapamil  (CALAN -SR) 180 MG CR tablet TAKE 1 TABLET BY MOUTH EVERY DAY 90 tablet 1   No current facility-administered medications for this visit.     Past Surgical History:  Procedure Laterality Date   ABDOMINAL HYSTERECTOMY     BIOPSY  04/27/2020  Procedure: BIOPSY;  Surgeon: Cindie Carlin POUR, DO;  Location: [facility];  Service: Endoscopy;;   BONE EXOSTOSIS EXCISION  04/25/2012   Procedure: EXOSTOSIS EXCISION;  Surgeon: Morene Donley Anon, DPM;  Location: [facility];  Service: Orthopedics;  Laterality: Left;  Retrocalcaneal Exostectomy Left Foot   BREAST BIOPSY Left 11/11/2019   Pseudoangiomatous stromal hyperplasia / PASH.   CATARACT EXTRACTION W/PHACO Right 09/08/2013   Procedure: CATARACT EXTRACTION PHACO AND INTRAOCULAR LENS PLACEMENT (IOC);  Surgeon: Cherene Mania, MD;  Location: [facility];  Service: Ophthalmology;  Laterality: Right;  CDE 9.13   CHOLECYSTECTOMY     COLONOSCOPY  04/16/2007   diminutive rectal polyp removed, scattered diverticula, path: tubular adenoma   COLONOSCOPY  04/14/2002   external hemorrhoids otherwise normal   COLONOSCOPY  05/24/2011   Dr. Rourk:tubular adenoma and hyperplastic polyp, long redundant  colon/left sided diverticulosis/internal hemorrhoids, incomplete examination, unable to reach the cecum despite external abdominal pressure, changing position. Barium enema incomplete.    COLONOSCOPY N/A 02/05/2015   Procedure: COLONOSCOPY;  Surgeon: Lamar CHRISTELLA Hollingshead, MD;  elongated colon, redundant colon-incomplete with inability to reach to deeply intubate the cecum despite external pressure and changing the patients position.    COLONOSCOPY WITH PROPOFOL  N/A 04/27/2020   non-bleeding internal hemorrhoids, sigmoid and descending colon diverticulosis, one 2 mm polyp in transverse colon, stool in entire examined colon. Surveillance 3 years due to borderline prep. Tubular adenoma.    CORONARY ARTERY BYPASS GRAFT  2000   triple bypass   ESOPHAGOGASTRODUODENOSCOPY (EGD) WITH PROPOFOL  N/A 04/27/2020   gastritis s/p biopsy, normal duodenum. Benign duodenal biopsy. No H.pylori.    S/P Hysterectomy       No Known Allergies    Family History  Problem Relation Age of Onset   Colon cancer Mother        diagnosed at age 56, passed away at age 69 from metastasis   Heart attack Father    Prostate cancer Brother    COPD Brother      Social History Ms. Bin reports that she has been smoking cigarettes. She has a 7.5 pack-year smoking history. She has never used smokeless tobacco. Ms. Soler reports no history of alcohol use.     Physical Examination Today's Vitals   03/18/24 0836 03/18/24 0904  BP: (!) 140/76 128/78  Pulse: 70   SpO2: 100%   Weight: 222 lb (100.7 kg)   Height: 5' 3 (1.6 m)    Body mass index is 39.33 kg/m.  Gen: resting comfortably, no acute distress HEENT: no scleral icterus, pupils equal round and reactive, no palptable cervical adenopathy,  CV: RRR, no m/rg, no jvd Resp: Clear to auscultation bilaterally GI: abdomen is soft, non-tender, non-distended, normal bowel sounds, no hepatosplenomegaly MSK: extremities are warm, no edema.  Skin: warm, no rash Neuro:   no focal deficits Psych: appropriate affect   Diagnostic Studies  12/2016 echo Study Conclusions   - Left ventricle: The cavity size was normal. Wall thickness was   increased in a pattern of moderate LVH. Systolic function was   normal. The estimated ejection fraction was in the range of 55%   to 60%. Wall motion was normal; there were no regional wall   motion abnormalities. Doppler parameters are consistent with   abnormal left ventricular relaxation (grade 1 diastolic   dysfunction). Doppler parameters are consistent with high   ventricular filling pressure. - Aortic valve: Mildly calcified annulus. Mildly thickened   leaflets. Valve area (VTI): 1.96 cm^2. Valve area (Vmax):  1.76   cm^2. - Mitral valve: Mildly calcified annulus. Mildly thickened leaflets   . - Technically difficult study.   Assessment and Plan   1. Chronic diastolic HF - euvolemic without symptoms, continue current meds   2. Resistant HTN - bp is at goal on manual recheck, continue current meds   3. CAD - no recent symptoms, continue current meds   4. Hyperlipidemia -at goal - pcp recently changed crestor  to 40mg  every other day, muscle symptoms have improved - continue current therapy  F/u 6 months     Dorn PHEBE Ross, M.D.
